# Patient Record
Sex: Female | Born: 1940 | Race: Black or African American | Hispanic: No | Smoking: Never smoker
Health system: Southern US, Community
[De-identification: ages and names within clinical notes are randomized; demographics above are authoritative.]

## PROBLEM LIST (undated history)

## (undated) DIAGNOSIS — N189 Chronic kidney disease, unspecified: Secondary | ICD-10-CM

## (undated) DIAGNOSIS — M2550 Pain in unspecified joint: Secondary | ICD-10-CM

## (undated) DIAGNOSIS — N201 Calculus of ureter: Secondary | ICD-10-CM

## (undated) DIAGNOSIS — Z87442 Personal history of urinary calculi: Secondary | ICD-10-CM

## (undated) DIAGNOSIS — R3915 Urgency of urination: Secondary | ICD-10-CM

## (undated) DIAGNOSIS — G4730 Sleep apnea, unspecified: Secondary | ICD-10-CM

## (undated) DIAGNOSIS — H9191 Unspecified hearing loss, right ear: Secondary | ICD-10-CM

## (undated) DIAGNOSIS — H905 Unspecified sensorineural hearing loss: Secondary | ICD-10-CM

## (undated) DIAGNOSIS — M1990 Unspecified osteoarthritis, unspecified site: Secondary | ICD-10-CM

## (undated) DIAGNOSIS — E785 Hyperlipidemia, unspecified: Secondary | ICD-10-CM

## (undated) DIAGNOSIS — K219 Gastro-esophageal reflux disease without esophagitis: Secondary | ICD-10-CM

## (undated) HISTORY — PX: INNER EAR SURGERY: SHX679

## (undated) HISTORY — PX: OTHER SURGICAL HISTORY: SHX169

## (undated) HISTORY — PX: CATARACT EXTRACTION, BILATERAL: SHX1313

## (undated) HISTORY — PX: DILATION AND CURETTAGE OF UTERUS: SHX78

## (undated) HISTORY — PX: OVARIAN CYST SURGERY: SHX726

---

## 1999-01-01 ENCOUNTER — Ambulatory Visit (HOSPITAL_COMMUNITY): Admission: RE | Admit: 1999-01-01 | Discharge: 1999-01-01 | Payer: Self-pay | Admitting: Family Medicine

## 1999-01-01 ENCOUNTER — Encounter: Payer: Self-pay | Admitting: Family Medicine

## 1999-06-30 ENCOUNTER — Ambulatory Visit (HOSPITAL_COMMUNITY): Admission: RE | Admit: 1999-06-30 | Discharge: 1999-06-30 | Payer: Self-pay | Admitting: Gastroenterology

## 1999-06-30 ENCOUNTER — Encounter: Payer: Self-pay | Admitting: Gastroenterology

## 2000-08-25 ENCOUNTER — Ambulatory Visit (HOSPITAL_COMMUNITY): Admission: RE | Admit: 2000-08-25 | Discharge: 2000-08-25 | Payer: Self-pay | Admitting: Family Medicine

## 2000-12-30 ENCOUNTER — Encounter: Payer: Self-pay | Admitting: Gastroenterology

## 2000-12-30 ENCOUNTER — Ambulatory Visit (HOSPITAL_COMMUNITY): Admission: RE | Admit: 2000-12-30 | Discharge: 2000-12-30 | Payer: Self-pay | Admitting: Gastroenterology

## 2001-05-10 ENCOUNTER — Encounter: Payer: Self-pay | Admitting: Family Medicine

## 2001-05-10 ENCOUNTER — Ambulatory Visit (HOSPITAL_COMMUNITY): Admission: RE | Admit: 2001-05-10 | Discharge: 2001-05-10 | Payer: Self-pay | Admitting: Family Medicine

## 2001-09-23 ENCOUNTER — Ambulatory Visit (HOSPITAL_BASED_OUTPATIENT_CLINIC_OR_DEPARTMENT_OTHER): Admission: RE | Admit: 2001-09-23 | Discharge: 2001-09-23 | Payer: Self-pay | Admitting: Family Medicine

## 2002-01-17 ENCOUNTER — Ambulatory Visit (HOSPITAL_BASED_OUTPATIENT_CLINIC_OR_DEPARTMENT_OTHER): Admission: RE | Admit: 2002-01-17 | Discharge: 2002-01-17 | Payer: Self-pay | Admitting: Family Medicine

## 2003-08-17 ENCOUNTER — Other Ambulatory Visit: Admission: RE | Admit: 2003-08-17 | Discharge: 2003-08-17 | Payer: Self-pay | Admitting: Family Medicine

## 2003-10-22 ENCOUNTER — Ambulatory Visit (HOSPITAL_COMMUNITY): Admission: RE | Admit: 2003-10-22 | Discharge: 2003-10-22 | Payer: Self-pay | Admitting: Family Medicine

## 2003-12-06 HISTORY — PX: COLONOSCOPY: SHX174

## 2003-12-06 HISTORY — PX: UPPER GASTROINTESTINAL ENDOSCOPY: SHX188

## 2004-01-04 ENCOUNTER — Ambulatory Visit (HOSPITAL_COMMUNITY): Admission: RE | Admit: 2004-01-04 | Discharge: 2004-01-04 | Payer: Self-pay | Admitting: Gastroenterology

## 2004-10-02 ENCOUNTER — Ambulatory Visit (HOSPITAL_COMMUNITY): Admission: RE | Admit: 2004-10-02 | Discharge: 2004-10-02 | Payer: Self-pay | Admitting: Gastroenterology

## 2004-10-06 ENCOUNTER — Encounter: Admission: RE | Admit: 2004-10-06 | Discharge: 2004-10-06 | Payer: Self-pay | Admitting: Gastroenterology

## 2009-01-16 ENCOUNTER — Encounter: Admission: RE | Admit: 2009-01-16 | Discharge: 2009-01-16 | Payer: Self-pay | Admitting: Family Medicine

## 2010-04-27 ENCOUNTER — Encounter: Payer: Self-pay | Admitting: Gastroenterology

## 2010-08-22 NOTE — Op Note (Signed)
NAME:  Penny Hernandez, Penny Hernandez              ACCOUNT NO.:  [id number]   MEDICAL RECORD NO.:  [id number]          PATIENT TYPE:  AMB   LOCATION:  ENDO                         FACILITY:  [facility]   PHYSICIAN:  James L. Malon Kindle., M.D.DATE OF BIRTH:  Jul 17, 1940   DATE OF PROCEDURE:  01/04/2004  DATE OF DISCHARGE:                                 OPERATIVE REPORT   PROCEDURE:  Colonoscopy.   MEDICATIONS:  The patient received a total of fentanyl 125 mcg, Versed 12 mg  for both procedures.   INDICATIONS:  Rectal bleeding in a woman with a strong family history of  colon cancer. Father has had colon cancer.   DESCRIPTION OF PROCEDURE:  Procedure had been explained to the patient and  consent obtained. With the patient in left lateral decubitus position,  Olympus scope was inserted and advanced under direct visualization. The  pediatric adjustable scope was used. Prep was excellent. Using abdominal  pressure and position changes, we were able to reach the cecum. The  ileocecal valve and appendiceal orifice were seen. The scope was withdrawn  to the cecum. Ascending colon, transverse colon, descending  and sigmoid  colon were seen well. There were no polyps seen and no diverticulosis. Scope  was withdrawn into the rectum. There were internal hemorrhoids seen in the  rectum. The scope was withdrawn, and the patient tolerated the procedure  well.   ASSESSMENT:  1.  Rectal bleeding, probably secondary to internal hemorrhoids, 578.1.  2.  Strong family history of colon cancer, V16.0.   PLAN:  Will give hemorrhoid instruction sheet and recommend repeating  colonoscopy in 5 years.      JLE/MEDQ  D:  01/04/2004  T:  01/04/2004  Job:  [id number]   cc:   Melida Quitter, M.D.  [address]  [place]  [place] [address]  Fax: [phone]

## 2010-08-22 NOTE — Op Note (Signed)
NAME:  Penny Hernandez, Penny Hernandez              ACCOUNT NO.:  [id number]   MEDICAL RECORD NO.:  [id number]          PATIENT TYPE:  AMB   LOCATION:  ENDO                         FACILITY:  [facility]   PHYSICIAN:  James L. Malon Kindle., M.D.DATE OF BIRTH:  1940/10/03   DATE OF PROCEDURE:  10/24/2004  DATE OF DISCHARGE:  10/02/2004                                 OPERATIVE REPORT   PROCEDURE:  Esophageal manometry.   INDICATIONS FOR PROCEDURE:  Chest pain possibly due to gastroesophageal  reflux or some sort of esophageal abnormality.  This is done to try to  identify these issues.   DESCRIPTION OF PROCEDURE:  The procedure was performed in the [facility]  Manometry Lab according to the usual protocol.  Results are as follows:  1.  Upper esophageal sphincter not shown on the tracings presented to me for      interpretation.  The computer analysis showed 86% relaxation.  2.  Esophageal body in the mid esophagus, the mean pressure was 78 mm, an      average of 11 swallows with a mean duration of 4.3 seconds.  Peristalsis      was normal and all swallows presented for evaluation.  3.  Lower esophageal sphincter mean pressure 19, relaxed with swallowing.   IMPRESSION:  Normal manometry with no gross abnormalities.   PLAN:  Proceed with a 24-hour pH study.       JLE/MEDQ  D:  10/24/2004  T:  10/24/2004  Job:  [id number]   cc:   Holley Bouche, M.D.  [address]  [place], [place] [address]  Fax: [phone]

## 2010-08-22 NOTE — Op Note (Signed)
NAME:  Penny Hernandez, Penny Hernandez              ACCOUNT NO.:  [id number]   MEDICAL RECORD NO.:  [id number]          PATIENT TYPE:  AMB   LOCATION:  ENDO                         FACILITY:  [facility]   PHYSICIAN:  James L. Malon Kindle., M.D.DATE OF BIRTH:  [birth year]   DATE OF PROCEDURE:  10/02/2004  DATE OF DISCHARGE:  10/02/2004                                 OPERATIVE REPORT   PROCEDURE:  A 24-hour pH study.   INDICATIONS FOR PROCEDURE:  Patient has had vague chest pain with  questionable esophageal reflux.  This study is done to evaluate for the  presence or absence of reflux.  Manometry done prior to this was normal.   RESULTS:  In the distal channel:  1.  Percent time pH less than 4, 1.1% with normal being less than 6.3% in      the upright position, in the recumbent position 0%.  Total time for pH      less than 4 was 11 minutes over 24 hours.  2.  Reflux episode 17, episodes greater than five minutes 0. Longest episode      2.8 minutes.  3.  Distal channel composite score (DeMeester score) 3.2 with normal being      less than 22.  4.  Symptom analysis:  The only significant symptom the patient had during      the study was vomiting which showed a poor correlation with reflux.  She      did have chest pain associated with vomiting, however.   IMPRESSION:  Study does not show significant reflux in a 24-period was  normal.  However, she does have vomiting which may be the source of her  symptoms.       JLE/MEDQ  D:  10/24/2004  T:  10/24/2004  Job:  [id number]   cc:   Holley Bouche, M.D.  [address]  [place], [place] [address]  Fax: [phone]

## 2010-08-22 NOTE — Op Note (Signed)
NAME:  Penny Hernandez, Penny Hernandez              ACCOUNT NO.:  [id number]   MEDICAL RECORD NO.:  [id number]          PATIENT TYPE:  AMB   LOCATION:  ENDO                         [facility]ACILITY:  [facility]   PHYSICIAN:  James L. Malon Kindle., M.D.DATE OF BIRTH:  11-06-1940   DATE OF PROCEDURE:  01/04/2004  DATE OF DISCHARGE:                                 OPERATIVE REPORT   PROCEDURE:  Esophagogastroduodenoscopy.   MEDICATIONS:  Lidocaine spray, fentanyl 62.5 mcg, Versed 6 mg IV.   INDICATIONS:  Chronic esophageal reflux.   DESCRIPTION OF PROCEDURE:  The procedure had been explained to the patient  and consent obtained. In the left lateral decubitus position, the Olympus  scope was inserted and advanced. The stomach was entered. The pylorus  identified and passed. The duodenum included the bulb and second portion  were seen _____________ unremarkable. The scope was withdrawn back into the  stomach. Pyloric channel was normal. The antrum and body of the stomach were  normal. There were no ulcerations or inflammation. Fundus and cardia were  seen in the retroflexed view and were normal. There was a hiatal hernia with  a widely patent GE junction. The distal and proximal esophagus were  endoscopically normal. The scope was withdrawn. The patient tolerated the  procedure well.   ASSESSMENT:  Widely patent gastroesophageal junction with no signs of  Barrett's esophagus, consistent with esophageal reflux disease, 530.81.  Continue Nexium. Will give her antireflux instructions and proceed with  colonoscopy at this time as planned.      JLE/MEDQ  D:  01/04/2004  T:  01/04/2004  Job:  [id number]   cc:   Melida Quitter, M.D.  [address]  [place]  [place] [address]  Fax: [phone]

## 2011-02-03 ENCOUNTER — Other Ambulatory Visit: Payer: Self-pay | Admitting: Family Medicine

## 2011-02-05 ENCOUNTER — Ambulatory Visit
Admission: RE | Admit: 2011-02-05 | Discharge: 2011-02-05 | Disposition: A | Discharge status: Home / Self Care | Payer: Medicare Other | Source: Ambulatory Visit | Attending: Family Medicine | Admitting: Family Medicine

## 2011-02-05 DIAGNOSIS — N189 Chronic kidney disease, unspecified: Secondary | ICD-10-CM

## 2011-02-05 HISTORY — DX: Chronic kidney disease, unspecified: N18.9

## 2011-02-05 MED ORDER — IOHEXOL 300 MG/ML  SOLN
125.0000 mL | Freq: Once | INTRAMUSCULAR | Status: AC | PRN
  Administered 2011-02-05: 125 mL via INTRAVENOUS

## 2011-02-16 ENCOUNTER — Other Ambulatory Visit: Payer: Self-pay | Admitting: Urology

## 2011-03-04 ENCOUNTER — Encounter (HOSPITAL_BASED_OUTPATIENT_CLINIC_OR_DEPARTMENT_OTHER): Payer: Self-pay | Admitting: *Deleted

## 2011-03-05 ENCOUNTER — Encounter (HOSPITAL_BASED_OUTPATIENT_CLINIC_OR_DEPARTMENT_OTHER): Payer: Self-pay | Admitting: *Deleted

## 2011-03-05 NOTE — H&P (Signed)
Penny Hernandez is a 70 year old female who presents for lithotripsy of a left ureteral stone. She had reported to him developing left lower quadrant pain which radiated into the left flank region and was associated with dark colored urine. She reported the pain was severe. She underwent a CT scan on 02/05/11 which revealed left hydronephrosis do to a 5 mm mid ureteral stone. It had measured Hounsfield units of approximately 650. There was also a 2 mm left renal stone that was nonobstructing. Since that time she has taken pain medication on a couple of occasions but has not had any further severe pain. She did pass a stone many years ago. She denies any fever, chills, nausea or vomiting but has noted a slight decrease in her appetite. The pain that she has had is not modified by positional change.   Past Medical History Problems  1. History of  Asthma 493.90 2. History of  Esophageal Reflux 530.81 3. History of  Hearing Loss 389.9 4. History of  Hypercholesterolemia 272.0 5. History of  Osteoarthritis V13.4 6. History of  Sleep Apnea 780.57 7. History of  Type 2 Diabetes Mellitus 250.00  Surgical History Problems  1. History of  Anterior Vesicourethropexy 2. History of  Cataract Surgery Bilateral 3. History of  Ear Surgery 4. History of  Hysteroscopy Of Uterus 5. History of  Oophorectomy V45.77  Current Meds 1. Advair Diskus 250-50 MCG/DOSE Inhalation Aerosol Powder Breath Activated; Therapy:  (Recorded:06Nov2012) to 2. CPAP; Therapy: (Recorded:06Nov2012) to 3. Fluticasone Propionate SUSP; Therapy: (Recorded:06Nov2012) to 4. Lotrisone 1-0.05 % External Cream; Therapy: (Recorded:06Nov2012) to 5. MetFORMIN HCl 500 MG Oral Tablet; Therapy: (Recorded:06Nov2012) to 6. Metoclopramide HCl 10 MG Oral Tablet; Therapy: (Recorded:06Nov2012) to 7. NexIUM 40 MG Oral Capsule Delayed Release; Therapy: (Recorded:06Nov2012) to 8. Pravastatin Sodium 40 MG Oral Tablet; Therapy: (Recorded:06Nov2012) to 9.  ProAir HFA 108 (90 Base) MCG/ACT Inhalation Aerosol Solution; Therapy:  (Recorded:06Nov2012) to 10. Sulindac 200 MG Oral Tablet; Therapy: (Recorded:06Nov2012) to 11. Triamcinolone Acetonide 0.1 % External Cream; Therapy: (Recorded:06Nov2012) to 12. Vitamin D 2000 UNIT Oral Tablet; Therapy: (Recorded:06Nov2012) to  Allergies Medication  1. No Known Drug Allergies  Family History Problems  1. Paternal history of  Colon Cancer V16.0 2. Family history of  Death In The Family Father 3. Family history of  Death In The Family Mother 4. Family history of  Family Health Status Children ___ Living Daughters 5. Family history of  Family Health Status Children ___ Living Sons 6. 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  Genitourinary: urinary urgency, nocturia, incontinence and urinary stream starts and stops.  Gastrointestinal: nausea, abdominal pain, heartburn and diarrhea.  Constitutional: night sweats and recent weight loss.  Integumentary: skin rash/lesion and pruritus.  ENT: sinus problems.    Vitals Vital Signs [Data Includes: Last 1 Day]   BMI Calculated: 37.61 BSA Calculated: 2.14 Height: 5 ft 6 in Weight: 234 lb  Blood Pressure: 141 / 86 Temperature: 97.6 F Heart Rate: 94  Physical Exam Constitutional: Well nourished and well developed . No acute distress.  ENT:. The  ears and nose are normal in appearance.  Neck: The appearance of the neck is normal and no neck mass is present.  Pulmonary: No  respiratory distress and normal respiratory rhythm and effort.  Cardiovascular: Heart rate and rhythm are normal . No peripheral edema.  Abdomen: The abdomen is mildly obese. The abdomen is soft and nontender. No masses are palpated. No CVA tenderness. No hernias are palpable. No hepatosplenomegaly noted.  Lymphatics: The femoral and inguinal nodes are not enlarged or tender.  Skin: Normal skin turgor, no visible rash and no visible skin lesions.  Neuro/Psych:. Mood and affect are appropriate.    Results/Data Urine [Data Includes: Last 1 Day]  06Nov2012  COLOR: YELLOW  Reference Range YELLOW APPEARANCE: CLEAR  Reference Range CLEAR SPECIFIC GRAVITY: 1.020  Reference Range 1.005-1.030 pH: 6.5  Reference Range 5.0-8.0 GLUCOSE: NEG mg/dL Reference Range NEG BILIRUBIN: NEG  Reference Range NEG KETONE: NEG mg/dL Reference Range NEG BLOOD: TRACE  Abnormal Reference Range NEG PROTEIN: TRACE mg/dL Reference Range NEG UROBILINOGEN: 0.2 mg/dL Reference Range 9.5-6.2 NITRITE: NEG  Reference Range NEG LEUKOCYTE ESTERASE: NEG  Reference Range NEG SQUAMOUS EPITHELIAL/HPF: FEW  Reference Range RARE WBC: 0-3 WBC/hpf Reference Range <4 RBC: 0-3 RBC/hpf Reference Range <4 BACTERIA: FEW  Abnormal Reference Range RARE CRYSTALS: NONE SEEN  Reference Range NEG CASTS: NONE SEEN  Reference Range NEG Other: FEW YEAST   Old records or history reviewed: Notes from Dr. Johnathan Hausen office as above.  The following images/tracing/specimen were independently visualized:  CT scan as above.  The following radiology reports were reviewed:Marland Kitchen    Assessment Assessed  1. Mid Ureteral Stone On The Left 592.1 2. Nephrolithiasis Of The Left Kidney 592.0 3. Hydronephrosis On The Left 591   I obtained a KUB today and noted that her stone lays over the iliac bone. It does not appear to a progressed. She does have hydronephrosis and has been having some decreased appetite so we discussed the treatment options. Medical  expulsive therapy could be undertaken although she does have some hydronephrosis and I think it would be best, since she remains symptomatic, to proceed with surgical intervention. Its location would preclude the use of lithotripsy so I went over ureteroscopy as the best option for her. We discussed the procedure in detail including its risks and complications as well as the probability of success and the need for a stent after the procedure. She understands and has elected to proceed.   Plan Health Maintenance (V70.0)  1. UA With REFLEX  Done: 06Nov2012 11:05AM Mid Ureteral Stone On The Left (592.1), Hydronephrosis On The Left (591)  2. Follow-up Schedule Surgery Office  Follow-up  Requested for: 06Nov2012 Mid Ureteral Stone On The Left (592.1), Nephrolithiasis Of The Left Kidney (592.0)  3. KUB  Requested for: 06Nov2012   She will be scheduled for an outpatient left ureteroscopy and laser lithotripsy of her left distal ureteral stone.

## 2011-03-05 NOTE — Progress Notes (Signed)
NPO AFTER MN. PT ARRIVES AT [phone]. NEEDS ISTAT AND EKG. MAY TAKE HYDROCODONE IF NEEDED W/ SIP OF WATER.

## 2011-03-06 ENCOUNTER — Ambulatory Visit (HOSPITAL_BASED_OUTPATIENT_CLINIC_OR_DEPARTMENT_OTHER)
Admission: RE | Admit: 2011-03-06 | Discharge: 2011-03-06 | Disposition: A | Discharge status: Home / Self Care | Payer: Medicare Other | Source: Ambulatory Visit | Attending: Urology | Admitting: Urology

## 2011-03-06 ENCOUNTER — Encounter (HOSPITAL_BASED_OUTPATIENT_CLINIC_OR_DEPARTMENT_OTHER): Payer: Self-pay | Admitting: *Deleted

## 2011-03-06 ENCOUNTER — Ambulatory Visit (HOSPITAL_BASED_OUTPATIENT_CLINIC_OR_DEPARTMENT_OTHER): Payer: Medicare Other | Admitting: Anesthesiology

## 2011-03-06 ENCOUNTER — Encounter (HOSPITAL_BASED_OUTPATIENT_CLINIC_OR_DEPARTMENT_OTHER): Payer: Self-pay | Admitting: Anesthesiology

## 2011-03-06 ENCOUNTER — Encounter (HOSPITAL_BASED_OUTPATIENT_CLINIC_OR_DEPARTMENT_OTHER)
Admission: RE | Disposition: A | Discharge status: Home / Self Care | Payer: Self-pay | Source: Ambulatory Visit | Attending: Urology

## 2011-03-06 ENCOUNTER — Other Ambulatory Visit: Payer: Self-pay

## 2011-03-06 DIAGNOSIS — J45909 Unspecified asthma, uncomplicated: Secondary | ICD-10-CM | POA: Insufficient documentation

## 2011-03-06 DIAGNOSIS — E7800 Pure hypercholesterolemia, unspecified: Secondary | ICD-10-CM | POA: Insufficient documentation

## 2011-03-06 DIAGNOSIS — N201 Calculus of ureter: Secondary | ICD-10-CM | POA: Insufficient documentation

## 2011-03-06 DIAGNOSIS — Z79899 Other long term (current) drug therapy: Secondary | ICD-10-CM | POA: Insufficient documentation

## 2011-03-06 DIAGNOSIS — K219 Gastro-esophageal reflux disease without esophagitis: Secondary | ICD-10-CM | POA: Insufficient documentation

## 2011-03-06 DIAGNOSIS — E119 Type 2 diabetes mellitus without complications: Secondary | ICD-10-CM | POA: Insufficient documentation

## 2011-03-06 HISTORY — DX: Personal history of urinary calculi: Z87.442

## 2011-03-06 HISTORY — DX: Urgency of urination: R39.15

## 2011-03-06 HISTORY — DX: Sleep apnea, unspecified: G47.30

## 2011-03-06 HISTORY — DX: Unspecified hearing loss, right ear: H91.91

## 2011-03-06 HISTORY — DX: Unspecified sensorineural hearing loss: H90.5

## 2011-03-06 HISTORY — DX: Calculus of ureter: N20.1

## 2011-03-06 HISTORY — DX: Pain in unspecified joint: M25.50

## 2011-03-06 HISTORY — PX: URETEROSCOPY: SHX842

## 2011-03-06 HISTORY — DX: Gastro-esophageal reflux disease without esophagitis: K21.9

## 2011-03-06 HISTORY — PX: CYSTOSCOPY/RETROGRADE/URETEROSCOPY/STONE EXTRACTION WITH BASKET: SHX5317

## 2011-03-06 LAB — GLUCOSE, CAPILLARY: Glucose-Capillary: 133 mg/dL — ABNORMAL HIGH (ref 70–99)

## 2011-03-06 LAB — POCT I-STAT 4, (NA,K, GLUC, HGB,HCT)
Glucose, Bld: 128 mg/dL — ABNORMAL HIGH (ref 70–99)
HCT: 39 % (ref 36.0–46.0)
Hemoglobin: 13.3 g/dL (ref 12.0–15.0)
Potassium: 3.6 mEq/L (ref 3.5–5.1)
Sodium: 142 mEq/L (ref 135–145)

## 2011-03-06 SURGERY — URETEROSCOPY
Anesthesia: General | Site: Ureter | Laterality: Left | Wound class: Clean Contaminated

## 2011-03-06 MED ORDER — KETOROLAC TROMETHAMINE 30 MG/ML IJ SOLN: 15.0000 mg | Freq: Once | INTRAMUSCULAR | Status: DC | PRN

## 2011-03-06 MED ORDER — CIPROFLOXACIN IN D5W 200 MG/100ML IV SOLN
200.0000 mg | INTRAVENOUS | Status: AC
  Administered 2011-03-06: 200 mg via INTRAVENOUS

## 2011-03-06 MED ORDER — PHENAZOPYRIDINE HCL 200 MG PO TABS: 200.0000 mg | ORAL_TABLET | Freq: Three times a day (TID) | ORAL | Status: AC | PRN

## 2011-03-06 MED ORDER — FENTANYL CITRATE 0.05 MG/ML IJ SOLN: 25.0000 ug | INTRAMUSCULAR | Status: DC | PRN

## 2011-03-06 MED ORDER — PROPOFOL 10 MG/ML IV EMUL
INTRAVENOUS | Status: DC | PRN
  Administered 2011-03-06: 200 mg via INTRAVENOUS

## 2011-03-06 MED ORDER — LACTATED RINGERS IV SOLN
INTRAVENOUS | Status: DC
  Administered 2011-03-06: 10:00:00 via INTRAVENOUS

## 2011-03-06 MED ORDER — SODIUM CHLORIDE 0.9 % IR SOLN
Status: DC | PRN
  Administered 2011-03-06: 3000 mL

## 2011-03-06 MED ORDER — FENTANYL CITRATE 0.05 MG/ML IJ SOLN
INTRAMUSCULAR | Status: DC | PRN
  Administered 2011-03-06: 50 ug via INTRAVENOUS

## 2011-03-06 MED ORDER — MIDAZOLAM HCL 5 MG/5ML IJ SOLN
INTRAMUSCULAR | Status: DC | PRN
  Administered 2011-03-06: 1 mg via INTRAVENOUS

## 2011-03-06 MED ORDER — LIDOCAINE HCL (CARDIAC) 20 MG/ML IV SOLN
INTRAVENOUS | Status: DC | PRN
  Administered 2011-03-06: 60 mg via INTRAVENOUS

## 2011-03-06 MED ORDER — HYDROCODONE-ACETAMINOPHEN 10-300 MG PO TABS: 1.0000 | ORAL_TABLET | Freq: Four times a day (QID) | ORAL | Status: DC | PRN

## 2011-03-06 MED ORDER — IOHEXOL 350 MG/ML SOLN
INTRAVENOUS | Status: DC | PRN
  Administered 2011-03-06: 50 mL via INTRAVENOUS

## 2011-03-06 SURGICAL SUPPLY — 27 items
ADAPTER CATH URET PLST 4-6FR (CATHETERS) ×2 IMPLANT
BAG DRAIN URO-CYSTO SKYTR STRL (DRAIN) ×2 IMPLANT
BASKET LASER NITINOL 1.9FR (BASKET) IMPLANT
BASKET STNLS GEMINI 4WIRE 3FR (BASKET) IMPLANT
BASKET ZERO TIP NITINOL 2.4FR (BASKET) IMPLANT
BRUSH URET BIOPSY 3F (UROLOGICAL SUPPLIES) IMPLANT
CANISTER SUCT LVC 12 LTR MEDI- (MISCELLANEOUS) IMPLANT
CATH INTERMIT  6FR 70CM (CATHETERS) ×2 IMPLANT
CATH URET 5FR 28IN CONE TIP (BALLOONS)
CATH URET 5FR 70CM CONE TIP (BALLOONS) IMPLANT
CLOTH BEACON ORANGE TIMEOUT ST (SAFETY) ×2 IMPLANT
DRAPE CAMERA CLOSED 9X96 (DRAPES) ×2 IMPLANT
ELECT REM PT RETURN 9FT ADLT (ELECTROSURGICAL)
ELECTRODE REM PT RTRN 9FT ADLT (ELECTROSURGICAL) IMPLANT
GLOVE BIO SURGEON STRL SZ 6.5 (GLOVE) ×2 IMPLANT
GLOVE BIO SURGEON STRL SZ8 (GLOVE) ×2 IMPLANT
GLOVE ECLIPSE 6.0 STRL STRAW (GLOVE) ×2 IMPLANT
GOWN BRE IMP SLV AUR LG STRL (GOWN DISPOSABLE) ×2 IMPLANT
GOWN PREVENTION PLUS LG XLONG (DISPOSABLE) ×2 IMPLANT
GOWN STRL REIN XL XLG (GOWN DISPOSABLE) ×2 IMPLANT
GUIDEWIRE ANG ZIPWIRE 038X150 (WIRE) IMPLANT
GUIDEWIRE STR DUAL SENSOR (WIRE) ×2 IMPLANT
IV NS IRRIG 3000ML ARTHROMATIC (IV SOLUTION) ×4 IMPLANT
LASER FIBER DISP (UROLOGICAL SUPPLIES) IMPLANT
PACK CYSTOSCOPY (CUSTOM PROCEDURE TRAY) ×2 IMPLANT
SHEATH URET ACCESS 12FR/35CM (UROLOGICAL SUPPLIES) IMPLANT
SHEATH URET ACCESS 12FR/55CM (UROLOGICAL SUPPLIES) IMPLANT

## 2011-03-06 NOTE — Transfer of Care (Signed)
Immediate Anesthesia Transfer of Care Note  Patient: Penny Hernandez  Procedure(s) Performed:  URETEROSCOPY - C-arm, camera, Digital Ureteroscope, Holmium Laser needed; CYSTOSCOPY/RETROGRADE/URETEROSCOPY/STONE EXTRACTION WITH BASKET  Patient Location: PACU  Anesthesia Type: General  Level of Consciousness: awake, alert  and oriented  Airway & Oxygen Therapy: Patient Spontanous Breathing and Patient connected to face mask oxygen  Post-op Assessment: Report given to PACU RN and Post -op Vital signs reviewed and stable  Post vital signs: Reviewed and stable  Complications: No apparent anesthesia complications

## 2011-03-06 NOTE — Anesthesia Postprocedure Evaluation (Signed)
  Anesthesia Post-op Note  Patient: Penny Hernandez  Procedure(s) Performed:  URETEROSCOPY - C-arm, camera, Digital Ureteroscope, Holmium Laser needed; CYSTOSCOPY/RETROGRADE/URETEROSCOPY/STONE EXTRACTION WITH BASKET  Patient Location: PACU  Anesthesia Type: General  Level of Consciousness: awake and alert   Airway and Oxygen Therapy: Patient Spontanous Breathing  Post-op Pain: mild  Post-op Assessment: Post-op Vital signs reviewed, Patient's Cardiovascular Status Stable, Respiratory Function Stable, Patent Airway and No signs of Nausea or vomiting  Post-op Vital Signs: stable  Complications: No apparent anesthesia complications

## 2011-03-06 NOTE — Op Note (Signed)
PATIENT:  Penny Hernandez  PRE-OPERATIVE DIAGNOSIS:  Left ureteral stone  POST-OPERATIVE DIAGNOSIS:  Same  PROCEDURE:  Procedure(s): 1. Cystoscopy with left retrograde pyelogram including interpretation. 2. Left ureteroscopy with laser lithotripsy and stone extraction. 3. Left Polaris stent placement (5 [place], 24 cm, string attached)   SURGEON:  Surgeon: Garnett Farm  INDICATION: Mrs. Ramsaran is a 70 year old female who was seen recently for a left ureteral calculus that was noted at the time of initial CT scanning on 02/05/11. The stone was noted the 5 mm and located in the mid ureter with Hounsfield units of approximately 650. She was placed on medical expulsive therapy and followup revealed the stone had not progressed and due to the fact that she remains symptomatic we discussed the surgical options for treatment and she has elected to proceed with the above-noted procedure.  ANESTHESIA:  General  EBL:  Minimal  DRAINS: 5 French, 24 cm Polaris stent (with string)  LOCAL MEDICATIONS USED:  None  SPECIMEN:   Stone  DISPOSITION OF SPECIMEN:  Given the patient  Description of procedure:  Patient was taken to the major OR and placed on the table.  She was administered general anesthesia and then moved to the dorsal lithotomy position.  Her genitalia was sterilely prepped and draped. An official timeout was performed.  Initially the 22 French cystoscope with 12 lens was passed under direct vision down the urethra which was noted be entirely normal. The bladder was then entered and fully inspected. It was noted be free of any tumors stones or inflammatory lesions. Ureteral orifices were of normal configuration and position. A 6 French open-ended ureteral catheter was then passed through the cystoscope into the left ureteral orifice in order to perform a left retrograde pyelogram.  A left retrograde pyelogram was performed by injecting full-strength contrast up the left ureter under  direct fluoroscopic control. It revealed a filling defect in the distal left ureter consistent with the stone. The remainder of the ureter was noted to be normal as was the intrarenal collecting system. I then passed a 0.038 inch floppy-tipped guidewire through the open ended catheter and into the area of the renal pelvis and this was left in place. The inner portion of a ureteral access sheath was then passed over the guidewire to gently dilate the intramural ureter. I then proceeded with ureteroscopy.  A 6 French rigid ureteroscope was then passed under direct vision through  the urethra and into the left orifice and up the left ureter. The stone was identified and I felt it was too large to extract and therefore elected to proceed with laser lithotripsy. The 200  holmium laser fiber was used to fragment the stone. I then used the nitinol basket to extract all of the stone fragments and reinspection of the ureter ureteroscopically revealed no further stone fragments and no injury to the ureter. I therefore backloaded the cystoscope over the guidewire and passed the stent over the guidewire into the area of the renal pelvis. As the guidewire was removed good curl was noted in the renal pelvis. The bladder was drained and the cystoscope was then removed. The patient tolerated the procedure well no intraoperative complications.  PLAN OF CARE: Discharge to home after PACU  PATIENT DISPOSITION:  PACU - hemodynamically stable.

## 2011-03-06 NOTE — Anesthesia Preprocedure Evaluation (Addendum)
Anesthesia Evaluation  Patient identified by MRN, date of birth, ID band Patient awake    Reviewed: Allergy & Precautions, H&P , NPO status , Patient's Chart, lab work & pertinent test results  Airway Mallampati: II TM Distance: <3 FB Neck ROM: Full    Dental No notable dental hx.    Pulmonary neg pulmonary ROS, sleep apnea ,  clear to auscultation  Pulmonary exam normal       Cardiovascular neg cardio ROS Regular Normal    Neuro/Psych Negative Neurological ROS  Negative Psych ROS   GI/Hepatic negative GI ROS, Neg liver ROS, GERD-  ,  Endo/Other  Negative Endocrine ROSDiabetes mellitus-Morbid obesity  Renal/GU negative Renal ROS  Genitourinary negative   Musculoskeletal negative musculoskeletal ROS (+)   Abdominal   Peds negative pediatric ROS (+)  Hematology negative hematology ROS (+)   Anesthesia Other Findings   Reproductive/Obstetrics negative OB ROS                          Anesthesia Physical Anesthesia Plan  ASA: III  Anesthesia Plan: General   Post-op Pain Management:    Induction: Intravenous  Airway Management Planned: LMA  Additional Equipment:   Intra-op Plan:   Post-operative Plan:   Informed Consent: I have reviewed the patients History and Physical, chart, labs and discussed the procedure including the risks, benefits and alternatives for the proposed anesthesia with the patient or authorized representative who has indicated his/her understanding and acceptance.   Dental advisory given  Plan Discussed with: CRNA  Anesthesia Plan Comments:         Anesthesia Quick Evaluation

## 2011-03-06 NOTE — Anesthesia Procedure Notes (Signed)
Procedure Name: LMA Insertion Date/Time: 03/06/2011 10:27 AM Performed by: Huel Coventry Pre-anesthesia Checklist: Patient identified, Emergency Drugs available, Suction available and Patient being monitored Patient Re-evaluated:Patient Re-evaluated prior to inductionOxygen Delivery Method: Circle System Utilized Preoxygenation: Pre-oxygenation with 100% oxygen Intubation Type: IV induction Ventilation: Mask ventilation without difficulty LMA: LMA inserted LMA Size: 4.0 Number of attempts: 1 Airway Equipment and Method: bite block Placement Confirmation: positive ETCO2 Tube secured with: Tape Dental Injury: Teeth and Oropharynx as per pre-operative assessment

## 2011-04-22 ENCOUNTER — Other Ambulatory Visit: Payer: Self-pay | Admitting: Obstetrics and Gynecology

## 2011-05-06 ENCOUNTER — Encounter (HOSPITAL_COMMUNITY): Payer: Self-pay | Admitting: Pharmacist

## 2011-05-13 ENCOUNTER — Other Ambulatory Visit: Payer: Self-pay | Admitting: Obstetrics and Gynecology

## 2011-05-13 ENCOUNTER — Encounter (HOSPITAL_COMMUNITY): Payer: Self-pay

## 2011-05-13 ENCOUNTER — Encounter (HOSPITAL_COMMUNITY)
Admission: RE | Admit: 2011-05-13 | Discharge: 2011-05-13 | Disposition: A | Discharge status: Home / Self Care | Payer: Medicare Other | Source: Ambulatory Visit | Attending: Obstetrics and Gynecology | Admitting: Obstetrics and Gynecology

## 2011-05-13 DIAGNOSIS — Z90710 Acquired absence of both cervix and uterus: Secondary | ICD-10-CM | POA: Diagnosis not present

## 2011-05-13 DIAGNOSIS — N9489 Other specified conditions associated with female genital organs and menstrual cycle: Secondary | ICD-10-CM | POA: Diagnosis not present

## 2011-05-13 DIAGNOSIS — N838 Other noninflammatory disorders of ovary, fallopian tube and broad ligament: Secondary | ICD-10-CM

## 2011-05-13 HISTORY — DX: Unspecified osteoarthritis, unspecified site: M19.90

## 2011-05-13 HISTORY — DX: Chronic kidney disease, unspecified: N18.9

## 2011-05-13 HISTORY — DX: Hyperlipidemia, unspecified: E78.5

## 2011-05-13 LAB — COMPREHENSIVE METABOLIC PANEL
Albumin: 4 g/dL (ref 3.5–5.2)
Alkaline Phosphatase: 106 U/L (ref 39–117)
BUN: 16 mg/dL (ref 6–23)
Chloride: 105 mEq/L (ref 96–112)
Glucose, Bld: 108 mg/dL — ABNORMAL HIGH (ref 70–99)
Potassium: 4 mEq/L (ref 3.5–5.1)
Total Bilirubin: 0.4 mg/dL (ref 0.3–1.2)

## 2011-05-13 LAB — CBC
HCT: 41.3 % (ref 36.0–46.0)
Hemoglobin: 13.3 g/dL (ref 12.0–15.0)
RBC: 4.46 MIL/uL (ref 3.87–5.11)
RDW: 12.3 % (ref 11.5–15.5)
WBC: 4.5 10*3/uL (ref 4.0–10.5)

## 2011-05-13 NOTE — Patient Instructions (Addendum)
   Your procedure is scheduled on:  Wednesday, Feb 13th Enter through the [employer] [facility] at: 930am Pick up the phone at the desk and dial [phone] and inform us of your arrival.  Please call this number if you have any problems the morning of surgery: [phone]  Remember: Do not eat food after midnight: Tuesday Do not drink clear liquids after: Tuesday Take these medicines the morning of surgery with a SIP OF WATER: Pravastatin  Do not wear jewelry, make-up, or FINGER nail polish Do not wear lotions, powders, perfumes or deodorant. Do not shave 48 hours prior to surgery. Do not bring valuables to the hospital.   Patients discharged on the day of surgery will not be allowed to drive home.  Home with Daughter Scherrie November  cell [phone] or Daughter Richrd Humbles  cell [phone].   Remember to use your hibiclens as instructed.Please shower with 1/2 bottle the evening before your surgery and the other 1/2 bottle the morning of surgery.

## 2011-05-14 ENCOUNTER — Ambulatory Visit (HOSPITAL_COMMUNITY)
Admission: RE | Admit: 2011-05-14 | Discharge: 2011-05-14 | Disposition: A | Discharge status: Home / Self Care | Payer: Medicare Other | Source: Ambulatory Visit | Attending: Obstetrics and Gynecology | Admitting: Obstetrics and Gynecology

## 2011-05-14 DIAGNOSIS — N838 Other noninflammatory disorders of ovary, fallopian tube and broad ligament: Secondary | ICD-10-CM

## 2011-05-14 DIAGNOSIS — N9489 Other specified conditions associated with female genital organs and menstrual cycle: Secondary | ICD-10-CM | POA: Diagnosis not present

## 2011-05-14 DIAGNOSIS — Z90710 Acquired absence of both cervix and uterus: Secondary | ICD-10-CM | POA: Insufficient documentation

## 2011-05-15 NOTE — H&P (Signed)
Penny Hernandez, DODDRIDGE NO.:  [id number]  MEDICAL RECORD NO.:  [id number]  LOCATION:                                 FACILITY:  PHYSICIAN:  [name], M.D.DATE OF BIRTH:  02/21/1941  DATE OF ADMISSION:  05/20/2011 DATE OF DISCHARGE:                             HISTORY & PHYSICAL   Date for surgery is May 20, 2011.  HISTORY OF PRESENT ILLNESS:  [name] is a 71 year old female, para 3- 0-0-3, who presents for a laparoscopic oophorectomy.  The patient has been followed at the [facility] and Gynecology Division of [facility].  The patient complains of fatigue and weight loss.  An ultrasound and CT scan were performed, which show an enlarged right ovary that was atypical for postmenopausal female.  Her CA-125 was within normal limits.  The patient is status post hysterectomy.  DRUG ALLERGIES:  No known drug allergies.  OBSTETRICAL HISTORY:  The patient has had 3 term vaginal deliveries.  PAST MEDICAL HISTORY:  The patient has a history of kidney stones.  She has had a hysterectomy as mentioned above.  She has diabetes.  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  She has had one of her ovaries removed.  She has increased cholesterol and gastroesophageal reflux disease.  CURRENT MEDICATIONS:  Nexium, metformin, pravastatin, and vitamin D.  SOCIAL HISTORY:  The patient denies cigarette use, alcohol use, and recreational drug use.  REVIEW OF SYSTEMS:  The patient has trouble hearing, particularly in her left ear.  See history of present illness.  FAMILY HISTORY:  The patient has a family history of colon cancer, asthma, emphysema, and rheumatic fever.  PHYSICAL EXAMINATION:  VITAL SIGNS:  Height is 5 feet 7 inches, weight is 233 pounds. HEENT:  Within normal limits. CHEST:  Clear. HEART:  Regular rate and rhythm. BREASTS:  Without masses. ABDOMEN:  Nontender, there are no fluid waves present. EXTREMITIES:   Grossly normal. NEUROLOGIC:  Grossly normal. PELVIC:  External genitalia is normal.  Vagina is normal except for relaxation.  Cervix is absent.  Uterus is absent.  Adnexa, no masses are appreciated and rectovaginal exam confirms.  ASSESSMENT: 1. Enlarged right ovary. 2. Fatigue. 3. Weight loss of uncertain etiology. 4. Diabetes. 5. Hypercholesterolemia. 6. Kidney stones. 7. Gastroesophageal reflux disease. 8. Obesity.  PLAN:  The patient will undergo a diagnostic laparoscopy and possibly a right laparoscopic oophorectomy.  She understands the indications for her surgical procedure, and she accepts the risks of, but not limited to, anesthetic complications, bleeding, infections, and possible damage to the surrounding organs.     [name], M.D.  05/18/2011 Surgery canceled. Repeat ultrasound showed no evidence of an enlarged ovary or pelvic mass.  AVS     AVS/MEDQ  D:  05/14/2011  T:  05/15/2011  Job:  [id number]

## 2011-05-18 DIAGNOSIS — R1900 Intra-abdominal and pelvic swelling, mass and lump, unspecified site: Secondary | ICD-10-CM | POA: Diagnosis not present

## 2011-05-20 ENCOUNTER — Ambulatory Visit (HOSPITAL_COMMUNITY)
Admission: RE | Admit: 2011-05-20 | Payer: Medicare Other | Source: Ambulatory Visit | Admitting: Obstetrics and Gynecology

## 2011-05-20 ENCOUNTER — Encounter (HOSPITAL_COMMUNITY): Admission: RE | Payer: Self-pay | Source: Ambulatory Visit

## 2011-05-20 SURGERY — LAPAROSCOPY OPERATIVE
Anesthesia: General | Laterality: Right

## 2011-05-28 DIAGNOSIS — R634 Abnormal weight loss: Secondary | ICD-10-CM | POA: Diagnosis not present

## 2011-05-29 DIAGNOSIS — M79609 Pain in unspecified limb: Secondary | ICD-10-CM | POA: Diagnosis not present

## 2011-05-29 DIAGNOSIS — B351 Tinea unguium: Secondary | ICD-10-CM | POA: Diagnosis not present

## 2011-06-03 ENCOUNTER — Encounter: Payer: Federal, State, Local not specified - PPO | Admitting: Obstetrics and Gynecology

## 2011-09-02 DIAGNOSIS — M79609 Pain in unspecified limb: Secondary | ICD-10-CM | POA: Diagnosis not present

## 2011-09-02 DIAGNOSIS — B351 Tinea unguium: Secondary | ICD-10-CM | POA: Diagnosis not present

## 2011-10-14 DIAGNOSIS — H26499 Other secondary cataract, unspecified eye: Secondary | ICD-10-CM | POA: Diagnosis not present

## 2011-10-19 ENCOUNTER — Ambulatory Visit (INDEPENDENT_AMBULATORY_CARE_PROVIDER_SITE_OTHER): Payer: Medicare Other | Admitting: Obstetrics and Gynecology

## 2011-10-19 ENCOUNTER — Encounter: Payer: Self-pay | Admitting: Obstetrics and Gynecology

## 2011-10-19 ENCOUNTER — Other Ambulatory Visit (INDEPENDENT_AMBULATORY_CARE_PROVIDER_SITE_OTHER): Payer: Medicare Other

## 2011-10-19 VITALS — BP 130/64 | Temp 98.6°F | Resp 16 | Ht 66.5 in | Wt 239.0 lb

## 2011-10-19 DIAGNOSIS — N7013 Chronic salpingitis and oophoritis: Secondary | ICD-10-CM | POA: Diagnosis not present

## 2011-10-19 DIAGNOSIS — N839 Noninflammatory disorder of ovary, fallopian tube and broad ligament, unspecified: Secondary | ICD-10-CM

## 2011-10-19 DIAGNOSIS — K921 Melena: Secondary | ICD-10-CM | POA: Diagnosis not present

## 2011-10-19 DIAGNOSIS — N7011 Chronic salpingitis: Secondary | ICD-10-CM | POA: Insufficient documentation

## 2011-10-19 DIAGNOSIS — N949 Unspecified condition associated with female genital organs and menstrual cycle: Secondary | ICD-10-CM | POA: Diagnosis not present

## 2011-10-19 DIAGNOSIS — IMO0002 Reserved for concepts with insufficient information to code with codable children: Secondary | ICD-10-CM

## 2011-10-19 DIAGNOSIS — Z9079 Acquired absence of other genital organ(s): Secondary | ICD-10-CM

## 2011-10-19 DIAGNOSIS — N838 Other noninflammatory disorders of ovary, fallopian tube and broad ligament: Secondary | ICD-10-CM

## 2011-10-19 DIAGNOSIS — R102 Pelvic and perineal pain: Secondary | ICD-10-CM

## 2011-10-19 DIAGNOSIS — E119 Type 2 diabetes mellitus without complications: Secondary | ICD-10-CM

## 2011-10-19 DIAGNOSIS — Z719 Counseling, unspecified: Secondary | ICD-10-CM

## 2011-10-19 DIAGNOSIS — E1149 Type 2 diabetes mellitus with other diabetic neurological complication: Secondary | ICD-10-CM | POA: Insufficient documentation

## 2011-10-19 DIAGNOSIS — E669 Obesity, unspecified: Secondary | ICD-10-CM | POA: Insufficient documentation

## 2011-10-19 DIAGNOSIS — Z90710 Acquired absence of both cervix and uterus: Secondary | ICD-10-CM | POA: Insufficient documentation

## 2011-10-19 DIAGNOSIS — E7800 Pure hypercholesterolemia, unspecified: Secondary | ICD-10-CM | POA: Insufficient documentation

## 2011-10-19 DIAGNOSIS — N8111 Cystocele, midline: Secondary | ICD-10-CM

## 2011-10-19 LAB — POCT URINALYSIS DIPSTICK
Ketones, UA: NEGATIVE
Leukocytes, UA: NEGATIVE
pH, UA: 6

## 2011-10-19 NOTE — Progress Notes (Addendum)
Vag. Discharge:no Odor:no Fever:no Irreg.Periods:no Dyspareunia:no Dysuria:no Frequency:yes Urgency:yes Hematuria:no Kidney stones:yes per pt a few months ago pt had it surgically removed Constipation:no Diarrhea:no Rectal Bleeding: no Vomiting:no Nausea:yes "Sometimes" Pregnant:no Fibroids:no Endometriosis:no Hx of Ovarian Cyst:yes pt had Left Cyst removed.  Hx IUD:no Hx STD-PID:no Appendectomy:no Gall Bladder Dz:no  HISTORY OF PRESENT ILLNESS  [name] is a 71 y.o. year old female,No obstetric history on file., who presents for a problem visit. The patient has had a total of normal hysterectomy and left oophorectomy.  A CT scan in November 2012 showed a 5.7 x 4.5 x 3.7 cm asymmetric enlargement of the right ovary.  An ultrasound was performed that showed a 5.0 x 4.3 x 3.7 cm cystic mass in the right adnexa that seemed most consistent with a hydrosalpinx.  No right ovary was identified.  A CA 125 blood tests was normal. The patient has a history of kidney stones.  Subjective:  The patient complains of a vague lower bilateral abdominal pain.  She also complains of a right mid abdominal pain.  She has occasional constipation and she has had rectal bleeding.  Objective:  BP 130/64  Temp 98.6 F (37 C) (Oral)  Resp 16  Ht 5' 6.5" (1.689 m)  Wt 239 lb (108.41 kg)  BMI 38.00 kg/m2   General: alert and no distress  Abdomen: No masses, tenderness, or abnormal skin changes are noted.  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  External genitalia: normal general appearance Vaginal: pelvic relaxation with cystocele and rectocele Adnexa: no masses or tenderness appreciated  Ultrasound: The uterus and left ovary are surgically absent.  There is a cystic tubular structure measuring 3.9 x 3.6 cm in the right adnexa.  There is also a cystic structure at the left vaginal cuff measuring 3.7 x 3.4 cm.  No solid component is seen.  Urinalysis:  Negative.  Assessment:  Hydrosalpinx Pelvic pain Hematochezia Obesity Diabetes History of kidney stones  Plan:  The evaluation and management of a hydrosalpinx and pelvic adhesions were discussed.  Surgery was reviewed although I do not recommend at this time.  The patient will consider this option and call if she wants to proceed.  Repeat ultrasound in 4-6 months.  Refer patient to a gastroenterologist for evaluation of rectal bleeding.  Return to office in 6 week(s).  30 min. Office evaluation was greater than 50% being face-to-face.   Leonard Schwartz M.D.  10/19/2011 5:43 PM

## 2011-10-20 ENCOUNTER — Other Ambulatory Visit: Payer: Self-pay | Admitting: Obstetrics and Gynecology

## 2011-10-20 DIAGNOSIS — N838 Other noninflammatory disorders of ovary, fallopian tube and broad ligament: Secondary | ICD-10-CM

## 2011-11-18 ENCOUNTER — Other Ambulatory Visit: Payer: Self-pay | Admitting: Gastroenterology

## 2011-11-18 DIAGNOSIS — R1310 Dysphagia, unspecified: Secondary | ICD-10-CM

## 2011-11-19 DIAGNOSIS — N6049 Mammary duct ectasia of unspecified breast: Secondary | ICD-10-CM | POA: Diagnosis not present

## 2011-11-19 DIAGNOSIS — N6459 Other signs and symptoms in breast: Secondary | ICD-10-CM | POA: Diagnosis not present

## 2011-11-24 ENCOUNTER — Ambulatory Visit
Admission: RE | Admit: 2011-11-24 | Discharge: 2011-11-24 | Disposition: A | Discharge status: Home / Self Care | Payer: Medicare Other | Source: Ambulatory Visit | Attending: Gastroenterology | Admitting: Gastroenterology

## 2011-11-24 DIAGNOSIS — K219 Gastro-esophageal reflux disease without esophagitis: Secondary | ICD-10-CM | POA: Diagnosis not present

## 2011-11-24 DIAGNOSIS — K228 Other specified diseases of esophagus: Secondary | ICD-10-CM | POA: Diagnosis not present

## 2011-11-24 DIAGNOSIS — R1310 Dysphagia, unspecified: Secondary | ICD-10-CM | POA: Diagnosis not present

## 2011-12-01 ENCOUNTER — Encounter: Payer: Medicare Other | Admitting: Obstetrics and Gynecology

## 2011-12-10 DIAGNOSIS — E785 Hyperlipidemia, unspecified: Secondary | ICD-10-CM | POA: Diagnosis not present

## 2011-12-11 DIAGNOSIS — M79609 Pain in unspecified limb: Secondary | ICD-10-CM | POA: Diagnosis not present

## 2011-12-11 DIAGNOSIS — B351 Tinea unguium: Secondary | ICD-10-CM | POA: Diagnosis not present

## 2011-12-18 ENCOUNTER — Ambulatory Visit (INDEPENDENT_AMBULATORY_CARE_PROVIDER_SITE_OTHER): Payer: Medicare Other | Admitting: Surgery

## 2011-12-18 ENCOUNTER — Encounter (INDEPENDENT_AMBULATORY_CARE_PROVIDER_SITE_OTHER): Payer: Self-pay | Admitting: Surgery

## 2011-12-18 VITALS — BP 132/84 | HR 84 | Temp 98.2°F | Resp 14 | Ht 66.5 in | Wt 239.8 lb

## 2011-12-18 DIAGNOSIS — D249 Benign neoplasm of unspecified breast: Secondary | ICD-10-CM | POA: Insufficient documentation

## 2011-12-18 NOTE — Progress Notes (Signed)
Chief Complaint:  Right ductal ectasia with discharge  History of Present Illness:  Penny Hernandez is an 71 y.o. female referred from [facility] for a nipple discharge and findings on ultrasound.  She is followed by Dr. Harris and Dr. Stringer. She had nipple discharge which led to her studies and on the ultrasound they thought there might be a 5 mm area within the duct it could be an intraductal papilloma contributing to the discharge.  On breast exam I was unable to express any discharge at this time. I did not feel any palpable masses in the retroareolar region. There is nothing suspicious about any of the findings there. No axillary adenopathy. Left side was a similar finding it to no palpable masses and no discharge.  Will likely need needle a right breast biopsy of this area. We'll set this up at [facility] and we'll schedule at her convenience.  Past Medical History  Diagnosis Date  . Ureteral calculus, left   . Diabetes mellitus     ORAL MED  . Asthma   . GERD (gastroesophageal reflux disease)     TAKES NEXIUM  . Urgency of urination OCCASIONAL  . History of kidney stones   . Joint pain OCCASIONAL    KNEES, LEGS, BACK  . Deafness congenital LEFT EAR  . 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 Chronic kidney disease 02/2011    kidney stone - left   . Hyperlipidemia   . Sleep apnea CPAP EVERY NIGHT    pt states "stopped 1 month ago"  . Arthritis     knees - no meds    Past Surgical History  Procedure Date  . Dilation and curettage of uterus YRS AGO    X2  . Ovarian cyst surgery YRS AGO  . Inner ear surgery LEFT -- YRS AGO    TO IMPROVE HEARING-- UNSUCCESSFUL  . Cataract extraction, bilateral     bilateral  . Ureteroscopy 03/06/2011    Procedure: URETEROSCOPY;  Surgeon: Mark C Ottelin, MD;  Location: [facility];  Service: Urology;  Laterality: Left;  C-arm, camera, Digital Ureteroscope, Holmium Laser needed  .  Cystoscopy/retrograde/ureteroscopy/stone extraction with basket 03/06/2011    Procedure: CYSTOSCOPY/RETROGRADE/URETEROSCOPY/STONE EXTRACTION WITH BASKET;  Surgeon: Mark C Ottelin, MD;  Location: [facility];  Service: Urology;  Laterality: Left;  . Upper gastrointestinal endoscopy 12/2003  . Colonoscopy 12/2003  . Svd     x 3    Current Outpatient Prescriptions  Medication Sig Dispense Refill  . albuterol (PROVENTIL HFA;VENTOLIN HFA) 108 (90 BASE) MCG/ACT inhaler Inhale 2 puffs into the lungs every 6 (six) hours as needed. For asthma      . cholecalciferol (VITAMIN D) 1000 UNITS tablet Take 1,000 Units by mouth daily.        . esomeprazole (NEXIUM) 40 MG capsule Take 40-80 mg by mouth daily. Sometimes takes 2 on some days for reflux      . Fluticasone-Salmeterol (ADVAIR) 250-50 MCG/DOSE AEPB Inhale 1 puff into the lungs as needed. Pt does not take this scheduled. Takes it as needed for asthma.      . metFORMIN (GLUCOPHAGE) 500 MG tablet Take 500 mg by mouth daily with breakfast.        . pravastatin (PRAVACHOL) 40 MG tablet Take 40 mg by mouth daily.       Review of patient's allergies indicates no known allergies. Family History  Problem   Relation Age of Onset  . Colon cancer Father   . Breast cancer Mother    Social History:   reports that she has never smoked. She has never used smokeless tobacco. She reports that she does not drink alcohol or use illicit drugs.   REVIEW OF SYSTEMS - PERTINENT POSITIVES ONLY: As above  Physical Exam:   Blood pressure 132/84, pulse 84, temperature 98.2 F (36.8 C), resp. rate 14, height 5' 6.5" (1.689 m), weight 239 lb 12.8 oz (108.773 kg). Body mass index is 38.13 kg/(m^2).  Gen:  WDWN AAF NAD  Neurological: Alert and oriented to person, place, and time. Motor and sensory function is grossly intact  Head: Normocephalic and atraumatic.  Eyes: Conjunctivae are normal. Pupils are equal, round, and reactive to light. No scleral  icterus.  Neck: Normal range of motion. Neck supple. No tracheal deviation or thyromegaly present. Breast:  No palpable masses and no demonstrable discharge.  The mammogram and ultrasound localized on the right.  Cardiovascular:  SR without murmurs or gallops.  No carotid bruits Respiratory: Effort normal.  No respiratory distress. No chest wall tenderness. Breath sounds normal.  No wheezes, rales or rhonchi.  Abdomen:  nontender  GU: Musculoskeletal: Normal range of motion. Extremities are nontender. No cyanosis, edema or clubbing noted Lymphadenopathy: No cervical, preauricular, postauricular or axillary adenopathy is present Skin: Skin is warm and dry. No rash noted. No diaphoresis. No erythema. No pallor. Pscyh: Normal mood and affect. Behavior is normal. Judgment and thought content normal.   LABORATORY RESULTS: No results found for this or any previous visit (from the past 48 hour(s)).  RADIOLOGY RESULTS: No results found.  Problem List: Patient Active Problem List  Diagnosis  . Pelvic pain in female  . Hydrosalpinx  . Hematochezia  . Obesity  . Cystocele  . S/P abdominal hysterectomy and left salpingo-oophorectomy  . Diabetes mellitus  . Hypercholesterolemia    Assessment & Plan: Right nipple discharge and 5 mm mass in duct.  Will needle loc at [facility] and do RBB    Matt B. Moses Odoherty, MD, FACS  [facility] [phone] beeper [phone]  12/18/2011 5:37 PM     

## 2011-12-18 NOTE — Patient Instructions (Addendum)
Thanks for your patience.  If you need further assistance after leaving the office, please call our office and speak with a [facility] nurse.  [phone].  If you want to leave a message for Dr. Brizeyda Holtmeyer, please call his office phone at [phone]. 

## 2012-01-04 NOTE — Progress Notes (Signed)
To come in for bmet-bring all meds and cpap dos

## 2012-01-05 ENCOUNTER — Encounter (HOSPITAL_BASED_OUTPATIENT_CLINIC_OR_DEPARTMENT_OTHER)
Admission: RE | Admit: 2012-01-05 | Discharge: 2012-01-05 | Disposition: A | Discharge status: Home / Self Care | Payer: Medicare Other | Source: Ambulatory Visit | Attending: Surgery | Admitting: Surgery

## 2012-01-05 DIAGNOSIS — E119 Type 2 diabetes mellitus without complications: Secondary | ICD-10-CM | POA: Diagnosis not present

## 2012-01-05 DIAGNOSIS — D249 Benign neoplasm of unspecified breast: Secondary | ICD-10-CM | POA: Diagnosis not present

## 2012-01-05 LAB — BASIC METABOLIC PANEL
BUN: 17 mg/dL (ref 6–23)
CO2: 29 mEq/L (ref 19–32)
Calcium: 9.9 mg/dL (ref 8.4–10.5)
Creatinine, Ser: 0.93 mg/dL (ref 0.50–1.10)
Glucose, Bld: 128 mg/dL — ABNORMAL HIGH (ref 70–99)

## 2012-01-06 ENCOUNTER — Encounter (HOSPITAL_BASED_OUTPATIENT_CLINIC_OR_DEPARTMENT_OTHER): Payer: Self-pay | Admitting: Anesthesiology

## 2012-01-06 ENCOUNTER — Encounter (HOSPITAL_BASED_OUTPATIENT_CLINIC_OR_DEPARTMENT_OTHER)
Admission: RE | Disposition: A | Discharge status: Home / Self Care | Payer: Self-pay | Source: Ambulatory Visit | Attending: Surgery

## 2012-01-06 ENCOUNTER — Encounter (HOSPITAL_BASED_OUTPATIENT_CLINIC_OR_DEPARTMENT_OTHER): Payer: Self-pay | Admitting: *Deleted

## 2012-01-06 ENCOUNTER — Ambulatory Visit (HOSPITAL_BASED_OUTPATIENT_CLINIC_OR_DEPARTMENT_OTHER)
Admission: RE | Admit: 2012-01-06 | Discharge: 2012-01-06 | Disposition: A | Discharge status: Home / Self Care | Payer: Medicare Other | Source: Ambulatory Visit | Attending: Surgery | Admitting: Surgery

## 2012-01-06 ENCOUNTER — Ambulatory Visit (HOSPITAL_BASED_OUTPATIENT_CLINIC_OR_DEPARTMENT_OTHER): Payer: Medicare Other | Admitting: Anesthesiology

## 2012-01-06 DIAGNOSIS — Z0189 Encounter for other specified special examinations: Secondary | ICD-10-CM | POA: Diagnosis not present

## 2012-01-06 DIAGNOSIS — E119 Type 2 diabetes mellitus without complications: Secondary | ICD-10-CM | POA: Insufficient documentation

## 2012-01-06 DIAGNOSIS — N6019 Diffuse cystic mastopathy of unspecified breast: Secondary | ICD-10-CM | POA: Diagnosis not present

## 2012-01-06 DIAGNOSIS — D249 Benign neoplasm of unspecified breast: Secondary | ICD-10-CM | POA: Insufficient documentation

## 2012-01-06 HISTORY — PX: BREAST BIOPSY: SHX20

## 2012-01-06 LAB — GLUCOSE, CAPILLARY: Glucose-Capillary: 129 mg/dL — ABNORMAL HIGH (ref 70–99)

## 2012-01-06 LAB — POCT HEMOGLOBIN-HEMACUE: Hemoglobin: 13.1 g/dL (ref 12.0–15.0)

## 2012-01-06 SURGERY — BREAST BIOPSY WITH NEEDLE LOCALIZATION
Anesthesia: General | Site: Breast | Laterality: Right | Wound class: Clean

## 2012-01-06 MED ORDER — SODIUM CHLORIDE 0.9 % IJ SOLN: 3.0000 mL | INTRAMUSCULAR | Status: DC | PRN

## 2012-01-06 MED ORDER — ACETAMINOPHEN 650 MG RE SUPP: 650.0000 mg | RECTAL | Status: DC | PRN

## 2012-01-06 MED ORDER — CHLORHEXIDINE GLUCONATE 4 % EX LIQD: 1.0000 "application " | Freq: Once | CUTANEOUS | Status: DC

## 2012-01-06 MED ORDER — CEFAZOLIN SODIUM-DEXTROSE 2-3 GM-% IV SOLR
2.0000 g | INTRAVENOUS | Status: AC
  Administered 2012-01-06: 2 g via INTRAVENOUS

## 2012-01-06 MED ORDER — OXYCODONE HCL 5 MG PO TABS: 5.0000 mg | ORAL_TABLET | ORAL | Status: DC | PRN

## 2012-01-06 MED ORDER — ACETAMINOPHEN 325 MG PO TABS: 650.0000 mg | ORAL_TABLET | ORAL | Status: DC | PRN

## 2012-01-06 MED ORDER — SODIUM CHLORIDE 0.9 % IJ SOLN: 3.0000 mL | Freq: Two times a day (BID) | INTRAMUSCULAR | Status: DC

## 2012-01-06 MED ORDER — MORPHINE SULFATE 2 MG/ML IJ SOLN: 1.0000 mg | INTRAMUSCULAR | Status: DC | PRN

## 2012-01-06 MED ORDER — HYDROMORPHONE HCL PF 1 MG/ML IJ SOLN: 0.2500 mg | INTRAMUSCULAR | Status: DC | PRN

## 2012-01-06 MED ORDER — BUPIVACAINE HCL (PF) 0.5 % IJ SOLN
INTRAMUSCULAR | Status: DC | PRN
  Administered 2012-01-06: 10 mL

## 2012-01-06 MED ORDER — LACTATED RINGERS IV SOLN
INTRAVENOUS | Status: DC
  Administered 2012-01-06 (×2): via INTRAVENOUS

## 2012-01-06 MED ORDER — ONDANSETRON HCL 4 MG/2ML IJ SOLN: 4.0000 mg | Freq: Four times a day (QID) | INTRAMUSCULAR | Status: DC | PRN

## 2012-01-06 MED ORDER — HEPARIN SODIUM (PORCINE) 5000 UNIT/ML IJ SOLN
5000.0000 [IU] | Freq: Once | INTRAMUSCULAR | Status: AC
  Administered 2012-01-06: 5000 [IU] via SUBCUTANEOUS

## 2012-01-06 MED ORDER — MIDAZOLAM HCL 2 MG/2ML IJ SOLN: 0.5000 mg | Freq: Once | INTRAMUSCULAR | Status: DC | PRN

## 2012-01-06 MED ORDER — PROPOFOL 10 MG/ML IV BOLUS
INTRAVENOUS | Status: DC | PRN
  Administered 2012-01-06: 200 mg via INTRAVENOUS

## 2012-01-06 MED ORDER — ONDANSETRON HCL 4 MG/2ML IJ SOLN
INTRAMUSCULAR | Status: DC | PRN
  Administered 2012-01-06: 4 mg via INTRAVENOUS

## 2012-01-06 MED ORDER — ACETAMINOPHEN 10 MG/ML IV SOLN
1000.0000 mg | Freq: Once | INTRAVENOUS | Status: AC
  Administered 2012-01-06: 1000 mg via INTRAVENOUS

## 2012-01-06 MED ORDER — FENTANYL CITRATE 0.05 MG/ML IJ SOLN
INTRAMUSCULAR | Status: DC | PRN
  Administered 2012-01-06 (×2): 50 ug via INTRAVENOUS

## 2012-01-06 MED ORDER — PROMETHAZINE HCL 25 MG/ML IJ SOLN: 6.2500 mg | INTRAMUSCULAR | Status: DC | PRN

## 2012-01-06 MED ORDER — SODIUM CHLORIDE 0.9 % IV SOLN: 250.0000 mL | INTRAVENOUS | Status: DC | PRN

## 2012-01-06 MED ORDER — MEPERIDINE HCL 25 MG/ML IJ SOLN: 6.2500 mg | INTRAMUSCULAR | Status: DC | PRN

## 2012-01-06 MED ORDER — OXYCODONE-ACETAMINOPHEN 5-325 MG PO TABS: 1.0000 | ORAL_TABLET | ORAL | Status: DC | PRN

## 2012-01-06 SURGICAL SUPPLY — 39 items
BANDAGE ELASTIC 6 VELCRO ST LF (GAUZE/BANDAGES/DRESSINGS) IMPLANT
BENZOIN TINCTURE PRP APPL 2/3 (GAUZE/BANDAGES/DRESSINGS) IMPLANT
BLADE SURG 15 STRL LF DISP TIS (BLADE) ×1 IMPLANT
BLADE SURG 15 STRL SS (BLADE) ×1
CANISTER SUCTION 1200CC (MISCELLANEOUS) ×2 IMPLANT
CLOTH BEACON ORANGE TIMEOUT ST (SAFETY) ×2 IMPLANT
COVER MAYO STAND STRL (DRAPES) ×2 IMPLANT
COVER TABLE BACK 60X90 (DRAPES) ×2 IMPLANT
DECANTER SPIKE VIAL GLASS SM (MISCELLANEOUS) IMPLANT
DERMABOND ADVANCED (GAUZE/BANDAGES/DRESSINGS) ×1
DERMABOND ADVANCED .7 DNX12 (GAUZE/BANDAGES/DRESSINGS) ×1 IMPLANT
DEVICE DUBIN W/COMP PLATE 8390 (MISCELLANEOUS) ×2 IMPLANT
DRAPE PED LAPAROTOMY (DRAPES) ×2 IMPLANT
ELECT COATED BLADE 2.86 ST (ELECTRODE) ×2 IMPLANT
ELECT REM PT RETURN 9FT ADLT (ELECTROSURGICAL) ×2
ELECTRODE REM PT RTRN 9FT ADLT (ELECTROSURGICAL) ×1 IMPLANT
GAUZE SPONGE 4X4 12PLY STRL LF (GAUZE/BANDAGES/DRESSINGS) ×2 IMPLANT
GAUZE SPONGE 4X4 16PLY XRAY LF (GAUZE/BANDAGES/DRESSINGS) ×2 IMPLANT
GLOVE BIO SURGEON STRL SZ8 (GLOVE) ×2 IMPLANT
GOWN PREVENTION PLUS XLARGE (GOWN DISPOSABLE) IMPLANT
GOWN PREVENTION PLUS XXLARGE (GOWN DISPOSABLE) ×4 IMPLANT
NEEDLE HYPO 25X1 1.5 SAFETY (NEEDLE) ×2 IMPLANT
NS IRRIG 1000ML POUR BTL (IV SOLUTION) ×2 IMPLANT
PACK BASIN DAY SURGERY FS (CUSTOM PROCEDURE TRAY) ×2 IMPLANT
PENCIL BUTTON HOLSTER BLD 10FT (ELECTRODE) ×2 IMPLANT
SLEEVE SCD COMPRESS KNEE MED (MISCELLANEOUS) ×2 IMPLANT
STRIP CLOSURE SKIN 1/2X4 (GAUZE/BANDAGES/DRESSINGS) IMPLANT
SUCTION FRAZIER TIP 10 FR DISP (SUCTIONS) ×2 IMPLANT
SUT SILK 2 0 SH (SUTURE) ×2 IMPLANT
SUT VIC AB 4-0 SH 18 (SUTURE) ×2 IMPLANT
SUT VIC AB 5-0 P-3 18X BRD (SUTURE) ×1 IMPLANT
SUT VIC AB 5-0 P3 18 (SUTURE) ×1
SYR BULB 3OZ (MISCELLANEOUS) ×2 IMPLANT
SYR CONTROL 10ML LL (SYRINGE) ×2 IMPLANT
TOWEL OR 17X24 6PK STRL BLUE (TOWEL DISPOSABLE) ×2 IMPLANT
TRAY DSU PREP LF (CUSTOM PROCEDURE TRAY) ×2 IMPLANT
TUBE CONNECTING 20X1/4 (TUBING) ×2 IMPLANT
WATER STERILE IRR 1000ML POUR (IV SOLUTION) IMPLANT
YANKAUER SUCT BULB TIP NO VENT (SUCTIONS) ×2 IMPLANT

## 2012-01-06 NOTE — Anesthesia Procedure Notes (Signed)
Procedure Name: LMA Insertion Date/Time: 01/06/2012 11:10 AM Performed by: Gar Gibbon Pre-anesthesia Checklist: Patient identified, Emergency Drugs available, Suction available and Patient being monitored Patient Re-evaluated:Patient Re-evaluated prior to inductionOxygen Delivery Method: Circle System Utilized Preoxygenation: Pre-oxygenation with 100% oxygen Intubation Type: IV induction Ventilation: Mask ventilation without difficulty LMA: LMA with gastric port inserted LMA Size: 4.0 Number of attempts: 1 Placement Confirmation: positive ETCO2 Tube secured with: Tape Dental Injury: Teeth and Oropharynx as per pre-operative assessment

## 2012-01-06 NOTE — Interval H&P Note (Signed)
History and Physical Interval Note:  01/06/2012 10:40 AM  Penny Hernandez  has presented today for surgery, with the diagnosis of Right intraductal papilloma  The various methods of treatment have been discussed with the patient and family. After consideration of risks, benefits and other options for treatment, the patient has consented to  Procedure(s) (LRB) with comments: BREAST BIOPSY WITH NEEDLE LOCALIZATION (Right) - Right needle localization breast biospy as a surgical intervention .  The patient's history has been reviewed, patient examined, no change in status, stable for surgery.  I have reviewed the patient's chart and labs.  Questions were answered to the patient's satisfaction. Needle loc placed in the right breast.  Will proceed with retroareolar excision    Itzelle Gains B

## 2012-01-06 NOTE — Progress Notes (Signed)
Hearing aid placed in rt ear by pt.

## 2012-01-06 NOTE — Transfer of Care (Signed)
Immediate Anesthesia Transfer of Care Note  Patient: Penny Hernandez  Procedure(s) Performed: Procedure(s) (LRB) with comments: BREAST BIOPSY WITH NEEDLE LOCALIZATION (Right) - Right needle localization breast biospy  Patient Location: PACU  Anesthesia Type: General  Level of Consciousness: sedated and patient cooperative  Airway & Oxygen Therapy: Patient Spontanous Breathing and Patient connected to face mask oxygen  Post-op Assessment: Report given to PACU RN and Post -op Vital signs reviewed and stable  Post vital signs: Reviewed and stable  Complications: No apparent anesthesia complications

## 2012-01-06 NOTE — H&P (View-Only) (Signed)
Chief Complaint:  Right ductal ectasia with discharge  History of Present Illness:  Penny Hernandez is an 72 y.o. female referred from [facility] for a nipple discharge and findings on ultrasound.  She is followed by Dr. Tiburcio Pea and Dr. Stefano Gaul. She had nipple discharge which led to her studies and on the ultrasound they thought there might be a 5 mm area within the duct it could be an intraductal papilloma contributing to the discharge.  On breast exam I was unable to express any discharge at this time. I did not feel any palpable masses in the retroareolar region. There is nothing suspicious about any of the findings there. No axillary adenopathy. Left side was a similar finding it to no palpable masses and no discharge.  Will likely need needle a right breast biopsy of this area. We'll set this up at [facility] and we'll schedule at her convenience.  Past Medical History  Diagnosis Date  . Ureteral calculus, left   . Diabetes mellitus     ORAL MED  . Asthma   . GERD (gastroesophageal reflux disease)     TAKES NEXIUM  . Urgency of urination OCCASIONAL  . History of kidney stones   . Joint pain OCCASIONAL    KNEES, LEGS, BACK  . Deafness congenital LEFT EAR  . 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 Chronic kidney disease 02/2011    kidney stone - left   . Hyperlipidemia   . Sleep apnea CPAP EVERY NIGHT    pt states "stopped 1 month ago"  . Arthritis     knees - no meds    Past Surgical History  Procedure Date  . Dilation and curettage of uterus YRS AGO    X2  . Ovarian cyst surgery YRS AGO  . Inner ear surgery LEFT -- YRS AGO    TO IMPROVE HEARING-- UNSUCCESSFUL  . Cataract extraction, bilateral     bilateral  . Ureteroscopy 03/06/2011    Procedure: URETEROSCOPY;  Surgeon: Garnett Farm, MD;  Location: [facility];  Service: Urology;  Laterality: Left;  C-arm, camera, Digital Ureteroscope, Holmium Laser needed  .  Cystoscopy/retrograde/ureteroscopy/stone extraction with basket 03/06/2011    Procedure: CYSTOSCOPY/RETROGRADE/URETEROSCOPY/STONE EXTRACTION WITH BASKET;  Surgeon: Garnett Farm, MD;  Location: [facility];  Service: Urology;  Laterality: Left;  . Upper gastrointestinal endoscopy 12/2003  . Colonoscopy 12/2003  . Svd     x 3    Current Outpatient Prescriptions  Medication Sig Dispense Refill  . albuterol (PROVENTIL HFA;VENTOLIN HFA) 108 (90 BASE) MCG/ACT inhaler Inhale 2 puffs into the lungs every 6 (six) hours as needed. For asthma      . cholecalciferol (VITAMIN D) 1000 UNITS tablet Take 1,000 Units by mouth daily.        Marland Kitchen esomeprazole (NEXIUM) 40 MG capsule Take 40-80 mg by mouth daily. Sometimes takes 2 on some days for reflux      . Fluticasone-Salmeterol (ADVAIR) 250-50 MCG/DOSE AEPB Inhale 1 puff into the lungs as needed. Pt does not take this scheduled. Takes it as needed for asthma.      . metFORMIN (GLUCOPHAGE) 500 MG tablet Take 500 mg by mouth daily with breakfast.        . pravastatin (PRAVACHOL) 40 MG tablet Take 40 mg by mouth daily.       Review of patient's allergies indicates no known allergies. Family History  Problem  Relation Age of Onset  . Colon cancer Father   . Breast cancer Mother    Social History:   reports that she has never smoked. She has never used smokeless tobacco. She reports that she does not drink alcohol or use illicit drugs.   REVIEW OF SYSTEMS - PERTINENT POSITIVES ONLY: As above  Physical Exam:   Blood pressure 132/84, pulse 84, temperature 98.2 F (36.8 C), resp. rate 14, height 5' 6.5" (1.689 m), weight 239 lb 12.8 oz (108.773 kg). Body mass index is 38.13 kg/(m^2).  Gen:  WDWN AAF NAD  Neurological: Alert and oriented to person, place, and time. Motor and sensory function is grossly intact  Head: Normocephalic and atraumatic.  Eyes: Conjunctivae are normal. Pupils are equal, round, and reactive to light. No scleral  icterus.  Neck: Normal range of motion. Neck supple. No tracheal deviation or thyromegaly present. Breast:  No palpable masses and no demonstrable discharge.  The mammogram and ultrasound localized on the right.  Cardiovascular:  SR without murmurs or gallops.  No carotid bruits Respiratory: Effort normal.  No respiratory distress. No chest wall tenderness. Breath sounds normal.  No wheezes, rales or rhonchi.  Abdomen:  nontender  GU: Musculoskeletal: Normal range of motion. Extremities are nontender. No cyanosis, edema or clubbing noted Lymphadenopathy: No cervical, preauricular, postauricular or axillary adenopathy is present Skin: Skin is warm and dry. No rash noted. No diaphoresis. No erythema. No pallor. Pscyh: Normal mood and affect. Behavior is normal. Judgment and thought content normal.   LABORATORY RESULTS: No results found for this or any previous visit (from the past 48 hour(s)).  RADIOLOGY RESULTS: No results found.  Problem List: Patient Active Problem List  Diagnosis  . Pelvic pain in female  . Hydrosalpinx  . Hematochezia  . Obesity  . Cystocele  . S/P abdominal hysterectomy and left salpingo-oophorectomy  . Diabetes mellitus  . Hypercholesterolemia    Assessment & Plan: Right nipple discharge and 5 mm mass in duct.  Will needle loc at [facility] and do RBB    Matt B. Daphine Deutscher, MD, [facility] [phone] beeper [phone]  12/18/2011 5:37 PM

## 2012-01-06 NOTE — Anesthesia Postprocedure Evaluation (Signed)
  Anesthesia Post-op Note  Patient: Penny Hernandez  Procedure(s) Performed: Procedure(s) (LRB) with comments: BREAST BIOPSY WITH NEEDLE LOCALIZATION (Right) - Right needle localization breast biospy  Patient Location: PACU  Anesthesia Type: General  Level of Consciousness: awake, alert , oriented and patient cooperative  Airway and Oxygen Therapy: Patient Spontanous Breathing  Post-op Pain: none  Post-op Assessment: Post-op Vital signs reviewed, Patient's Cardiovascular Status Stable, Respiratory Function Stable, Patent Airway, No signs of Nausea or vomiting and Pain level controlled  Post-op Vital Signs: Reviewed and stable  Complications: No apparent anesthesia complications

## 2012-01-06 NOTE — Op Note (Signed)
Surgeon: Wenda Low, MD, FACS  Asst:  none  Anes:  general  Procedure: Needle localized right breast biopsy  Diagnosis: Path pending  Complications: none  EBL:   30 cc  Description of Procedure:  Needle localization at [facility].  Patient had a needle localization as well as the right side. She had a complex ductal system in the right breast. Wire was placed fairly shallowly beneath the). Fortunately at the operating table I was able to see the duct that was draining and cannulated it with a duct probe. I made a circumareolar incision medially elevating the areola and excised the retroareolar tissue that was serviced by this draining duct. There was mucus and debris in some of these dilated ducts were removed in toto in addition found a firm area more deep and posterior which I excised and sent as a separate specimen. There is really nothing that we could see on the needle localization post-mammogram syndesmosis findings were ultrasound driven. The wound was carefully inspected irrigated. Hemostasis was achieved with 4-0 Vicryl figure-of-eight sutures and electrocautery. Was injected with Marcaine and closed 4-0 Vicryl with a Dermabond. A compression dressing was applied. Patient was taken to the recovery room in satisfactory condition will go home.  Matt B. Daphine Deutscher, MD, [facility], [place] [phone]

## 2012-01-06 NOTE — Anesthesia Preprocedure Evaluation (Addendum)
Anesthesia Evaluation  Patient identified by MRN, date of birth, ID band Patient awake    Reviewed: Allergy & Precautions, H&P , NPO status , Patient's Chart, lab work & pertinent test results  History of Anesthesia Complications Negative for: history of anesthetic complications  Airway Mallampati: II TM Distance: >3 FB Neck ROM: Full    Dental  (+) Teeth Intact and Dental Advisory Given   Pulmonary asthma (well controlled) , sleep apnea and Continuous Positive Airway Pressure Ventilation ,  breath sounds clear to auscultation  Pulmonary exam normal       Cardiovascular negative cardio ROS  Rhythm:Regular Rate:Normal     Neuro/Psych negative neurological ROS     GI/Hepatic Neg liver ROS, GERD-  Medicated and Controlled,  Endo/Other  diabetes (glu 129), Well Controlled, Type 2, Oral Hypoglycemic AgentsMorbid obesity  Renal/GU Renal disease     Musculoskeletal   Abdominal (+) + obese,   Peds  Hematology negative hematology ROS (+)   Anesthesia Other Findings   Reproductive/Obstetrics                          Anesthesia Physical Anesthesia Plan  ASA: III  Anesthesia Plan: General   Post-op Pain Management:    Induction: Intravenous  Airway Management Planned: LMA  Additional Equipment:   Intra-op Plan:   Post-operative Plan:   Informed Consent: I have reviewed the patients History and Physical, chart, labs and discussed the procedure including the risks, benefits and alternatives for the proposed anesthesia with the patient or authorized representative who has indicated his/her understanding and acceptance.   Dental advisory given  Plan Discussed with: CRNA and Surgeon  Anesthesia Plan Comments: (Plan routine monitors, GA- LMA OK)        Anesthesia Quick Evaluation

## 2012-01-07 ENCOUNTER — Encounter (HOSPITAL_BASED_OUTPATIENT_CLINIC_OR_DEPARTMENT_OTHER): Payer: Self-pay | Admitting: Surgery

## 2012-01-11 ENCOUNTER — Telehealth (INDEPENDENT_AMBULATORY_CARE_PROVIDER_SITE_OTHER): Payer: Self-pay | Admitting: General Surgery

## 2012-01-11 NOTE — Telephone Encounter (Signed)
Message copied by Littie Deeds on Mon Jan 11, 2012  4:35 PM ------      Message from: June Leap      Created: Mon Jan 11, 2012  3:53 PM       Could you please call and give patient a po appt for Rt br bx...she didn't call in, I was working on the list at [phone] and couldn't find anything...thx

## 2012-01-11 NOTE — Telephone Encounter (Signed)
Spoke with Penny Hernandez and informed her that she would have her first PO appt w/ Dr. Kerry Fort on 10/16 at 2:10.  I also informed her that I would have Dr. Kerry Fort review her pathology.

## 2012-01-14 ENCOUNTER — Telehealth (INDEPENDENT_AMBULATORY_CARE_PROVIDER_SITE_OTHER): Payer: Self-pay | Admitting: General Surgery

## 2012-01-14 NOTE — Telephone Encounter (Signed)
Called pt and informed her that her pathology cam back as an intraductal papilloma.  She was pleased with this.

## 2012-01-18 DIAGNOSIS — R1310 Dysphagia, unspecified: Secondary | ICD-10-CM | POA: Diagnosis not present

## 2012-01-20 ENCOUNTER — Encounter (INDEPENDENT_AMBULATORY_CARE_PROVIDER_SITE_OTHER): Payer: Self-pay | Admitting: Surgery

## 2012-01-20 ENCOUNTER — Ambulatory Visit (INDEPENDENT_AMBULATORY_CARE_PROVIDER_SITE_OTHER): Payer: Medicare Other | Admitting: Surgery

## 2012-01-20 VITALS — BP 132/82 | HR 85 | Temp 98.0°F | Resp 18 | Ht 66.0 in | Wt 240.0 lb

## 2012-01-20 DIAGNOSIS — D249 Benign neoplasm of unspecified breast: Secondary | ICD-10-CM

## 2012-01-20 NOTE — Progress Notes (Signed)
1. Breast, lumpectomy, Right - INTRADUCTAL PAPILLOMA SEE COMMENT. 2. Breast, excision, Right - INTRADUCTAL PAPILLOMA WITH ASSOCIATED FIBROCYSTIC CHANGE, SEE COMMENT.  Penny Hernandez 71 y.o.  Body mass index is 38.74 kg/(m^2).  Patient Active Problem List  Diagnosis  . Pelvic pain in female  . Hydrosalpinx  . Hematochezia  . Obesity  . Cystocele  . S/P abdominal hysterectomy and left salpingo-oophorectomy  . Diabetes mellitus  . Hypercholesterolemia  . Papilloma of breast    No Known Allergies  Past Surgical History  Procedure Date  . Dilation and curettage of uterus YRS AGO    X2  . Ovarian cyst surgery YRS AGO  . Inner ear surgery LEFT -- YRS AGO    TO IMPROVE HEARING-- UNSUCCESSFUL  . Cataract extraction, bilateral     bilateral  . Ureteroscopy 03/06/2011    Procedure: URETEROSCOPY;  Surgeon: Garnett Farm, MD;  Location: [facility];  Service: Urology;  Laterality: Left;  C-arm, camera, Digital Ureteroscope, Holmium Laser needed  . Cystoscopy/retrograde/ureteroscopy/stone extraction with basket 03/06/2011    Procedure: CYSTOSCOPY/RETROGRADE/URETEROSCOPY/STONE EXTRACTION WITH BASKET;  Surgeon: Garnett Farm, MD;  Location: [facility];  Service: Urology;  Laterality: Left;  . Upper gastrointestinal endoscopy 12/2003  . Colonoscopy 12/2003  . Svd     x 3  . Breast biopsy 01/06/2012    Procedure: BREAST BIOPSY WITH NEEDLE LOCALIZATION;  Surgeon: Valarie Merino, MD;  Location: [facility];  Service: General;  Laterality: Right;  Right needle localization breast biospy   [name], MD No diagnosis found.  Incision looks good.  Intraductal papilloma.  Copy of path report given to the patient.  I took care of her husband Eriko Economos who died of pancreatic cancer 7 years ago.   Matt B. Daphine Deutscher, MD, [facility] [phone] beeper [phone]  01/20/2012 2:05 PM

## 2012-01-20 NOTE — Patient Instructions (Addendum)
Thanks for your patience.  If you need further assistance after leaving the office, please call our office and speak with a [facility] nurse.  [phone].  If you want to leave a message for Dr. Hulbert Branscome, please call his office phone at [phone]. 

## 2012-01-28 ENCOUNTER — Encounter: Payer: Self-pay | Admitting: Obstetrics and Gynecology

## 2012-01-28 ENCOUNTER — Ambulatory Visit (INDEPENDENT_AMBULATORY_CARE_PROVIDER_SITE_OTHER): Payer: Medicare Other | Admitting: Obstetrics and Gynecology

## 2012-01-28 VITALS — BP 110/64 | Wt 242.0 lb

## 2012-01-28 DIAGNOSIS — N7011 Chronic salpingitis: Secondary | ICD-10-CM

## 2012-01-28 DIAGNOSIS — N7013 Chronic salpingitis and oophoritis: Secondary | ICD-10-CM

## 2012-01-28 DIAGNOSIS — Z719 Counseling, unspecified: Secondary | ICD-10-CM

## 2012-01-28 NOTE — Progress Notes (Signed)
HISTORY OF PRESENT ILLNESS  [name] is a 71 y.o. year old female,G3P3, who presents for a problem visit. The patient is status post hysterectomy and left salpingo-oophorectomy.  She was evaluated for abdominal pain and was found to have a cystic mass in the right adnexa that was most consistent with a hydrosalpinx.  Subjective:  The patient complains of a vague right lower quadrant discomfort.  Objective:  BP 110/64  Wt 242 lb (109.77 kg)   General: no distress Resp: clear to auscultation bilaterally Cardio: regular rate and rhythm, S1, S2 normal, no murmur, click, rub or gallop GI: soft and nontender  Exam deferred.  Assessment:  Right hydrosalpinx Right lower quadrant pain  Plan:  Pelvic ultrasound  Return to office prn if symptoms worsen or fail to improve.   Leonard Schwartz M.D.  01/28/2012 6:32 PM

## 2012-02-08 ENCOUNTER — Ambulatory Visit (INDEPENDENT_AMBULATORY_CARE_PROVIDER_SITE_OTHER): Payer: Medicare Other

## 2012-02-08 ENCOUNTER — Ambulatory Visit (INDEPENDENT_AMBULATORY_CARE_PROVIDER_SITE_OTHER): Payer: Medicare Other | Admitting: Obstetrics and Gynecology

## 2012-02-08 ENCOUNTER — Encounter: Payer: Self-pay | Admitting: Obstetrics and Gynecology

## 2012-02-08 VITALS — BP 136/68 | Temp 98.4°F | Resp 18 | Ht 66.5 in | Wt 243.0 lb

## 2012-02-08 DIAGNOSIS — N7013 Chronic salpingitis and oophoritis: Secondary | ICD-10-CM

## 2012-02-08 DIAGNOSIS — R32 Unspecified urinary incontinence: Secondary | ICD-10-CM

## 2012-02-08 DIAGNOSIS — E119 Type 2 diabetes mellitus without complications: Secondary | ICD-10-CM | POA: Diagnosis not present

## 2012-02-08 DIAGNOSIS — N7011 Chronic salpingitis: Secondary | ICD-10-CM

## 2012-02-08 NOTE — Progress Notes (Signed)
HISTORY OF PRESENT ILLNESS  [name] is a 71 y.o. year old female,G3P3, who presents for a problem visit. The patient complains of a vague right lower quadrant pain.  She is status post hysterectomy and left salpingo-oophorectomy.  She was found to have a cystic structure in the right adnexa that was consistent with a hydrosalpinx.  A CA 125 was normal. The patient has had a bladder suspension procedure.  Subjective:  The patient complains of a vague right lower quadrant pain.  She also complains of urinary incontinence. The patient has diabetes and she admits that her sugars have not been under good control.  Objective:  BP 136/68  Temp 98.4 F (36.9 C) (Oral)  Resp 18  Ht 5' 6.5" (1.689 m)  Wt 243 lb (110.224 kg)  BMI 38.63 kg/m2   GI: soft and nontender  Exam deferred.  Ultrasound: Uterus is absent.  Ovaries not identified.  Simple cystic tubular structure in the right adnexa measures 3.7 x 3.1 cm.  A simple cyst in the midline measures 5.5 x 3.5 cm.  No nodularity is noted.  Assessment:  Hydrosalpinx on the right. Vague right lower quadrant pain. Diabetes not well controlled. Urinary incontinence.  Plan:  A long discussion with her once again about the hydrosalpinx.  We discussed the risk and benefits of surgical evaluation.  We discussed my concerns about adhesion formation.  My recommendation is that we continue to observe only for now.  The patient agrees.  The patient will try to improve her exercising and improve her blood sugars.  Evaluation and management of urinary incontinence after a bladder suspension procedure were reviewed.  The patient will return in 4 weeks to that we can discuss further.  Return to office in 4 week(s).  25 min. Visit with greater than 50% being face-to-face.  Leonard Schwartz M.D.  02/08/2012 7:32 PM    Vag.  Discharge:no Odor:no Fever:no Irreg.Periods:no Dyspareunia:no Dysuria:no Frequency:yes Urgency:yes Hematuria:no Kidney stones:no Constipation:no Diarrhea:no Rectal Bleeding: no Vomiting:no Nausea:yes Pregnant:no Fibroids:no Endometriosis:no Hx of Ovarian Cyst:no Hx IUD:no Hx STD-PID:no Appendectomy:no Gall Bladder Dz:no

## 2012-02-23 DIAGNOSIS — E559 Vitamin D deficiency, unspecified: Secondary | ICD-10-CM | POA: Diagnosis not present

## 2012-02-23 DIAGNOSIS — J45909 Unspecified asthma, uncomplicated: Secondary | ICD-10-CM | POA: Diagnosis not present

## 2012-02-23 DIAGNOSIS — R079 Chest pain, unspecified: Secondary | ICD-10-CM | POA: Diagnosis not present

## 2012-02-23 DIAGNOSIS — E1142 Type 2 diabetes mellitus with diabetic polyneuropathy: Secondary | ICD-10-CM | POA: Diagnosis not present

## 2012-02-23 DIAGNOSIS — Z0000 Encounter for general adult medical examination without abnormal findings: Secondary | ICD-10-CM | POA: Diagnosis not present

## 2012-02-23 DIAGNOSIS — E1149 Type 2 diabetes mellitus with other diabetic neurological complication: Secondary | ICD-10-CM | POA: Diagnosis not present

## 2012-02-23 DIAGNOSIS — K219 Gastro-esophageal reflux disease without esophagitis: Secondary | ICD-10-CM | POA: Diagnosis not present

## 2012-02-23 DIAGNOSIS — E785 Hyperlipidemia, unspecified: Secondary | ICD-10-CM | POA: Diagnosis not present

## 2012-03-15 DIAGNOSIS — M79609 Pain in unspecified limb: Secondary | ICD-10-CM | POA: Diagnosis not present

## 2012-03-15 DIAGNOSIS — B351 Tinea unguium: Secondary | ICD-10-CM | POA: Diagnosis not present

## 2012-06-24 DIAGNOSIS — M79609 Pain in unspecified limb: Secondary | ICD-10-CM | POA: Diagnosis not present

## 2012-06-24 DIAGNOSIS — B351 Tinea unguium: Secondary | ICD-10-CM | POA: Diagnosis not present

## 2012-07-11 DIAGNOSIS — E785 Hyperlipidemia, unspecified: Secondary | ICD-10-CM | POA: Diagnosis not present

## 2012-07-11 DIAGNOSIS — E1149 Type 2 diabetes mellitus with other diabetic neurological complication: Secondary | ICD-10-CM | POA: Diagnosis not present

## 2012-07-11 DIAGNOSIS — R42 Dizziness and giddiness: Secondary | ICD-10-CM | POA: Diagnosis not present

## 2012-07-21 DIAGNOSIS — R1310 Dysphagia, unspecified: Secondary | ICD-10-CM | POA: Diagnosis not present

## 2012-07-21 DIAGNOSIS — K219 Gastro-esophageal reflux disease without esophagitis: Secondary | ICD-10-CM | POA: Diagnosis not present

## 2012-08-01 DIAGNOSIS — R42 Dizziness and giddiness: Secondary | ICD-10-CM | POA: Diagnosis not present

## 2012-09-28 DIAGNOSIS — E119 Type 2 diabetes mellitus without complications: Secondary | ICD-10-CM | POA: Diagnosis not present

## 2012-09-28 DIAGNOSIS — I889 Nonspecific lymphadenitis, unspecified: Secondary | ICD-10-CM | POA: Diagnosis not present

## 2012-09-28 DIAGNOSIS — H8110 Benign paroxysmal vertigo, unspecified ear: Secondary | ICD-10-CM | POA: Diagnosis not present

## 2012-09-28 DIAGNOSIS — E559 Vitamin D deficiency, unspecified: Secondary | ICD-10-CM | POA: Diagnosis not present

## 2012-10-03 DIAGNOSIS — M779 Enthesopathy, unspecified: Secondary | ICD-10-CM | POA: Diagnosis not present

## 2012-10-03 DIAGNOSIS — B351 Tinea unguium: Secondary | ICD-10-CM | POA: Diagnosis not present

## 2012-12-13 DIAGNOSIS — N644 Mastodynia: Secondary | ICD-10-CM | POA: Diagnosis not present

## 2013-01-10 DIAGNOSIS — J45909 Unspecified asthma, uncomplicated: Secondary | ICD-10-CM | POA: Diagnosis not present

## 2013-01-10 DIAGNOSIS — K219 Gastro-esophageal reflux disease without esophagitis: Secondary | ICD-10-CM | POA: Diagnosis not present

## 2013-01-10 DIAGNOSIS — E1149 Type 2 diabetes mellitus with other diabetic neurological complication: Secondary | ICD-10-CM | POA: Diagnosis not present

## 2013-01-10 DIAGNOSIS — E559 Vitamin D deficiency, unspecified: Secondary | ICD-10-CM | POA: Diagnosis not present

## 2013-01-10 DIAGNOSIS — E1142 Type 2 diabetes mellitus with diabetic polyneuropathy: Secondary | ICD-10-CM | POA: Diagnosis not present

## 2013-01-10 DIAGNOSIS — E785 Hyperlipidemia, unspecified: Secondary | ICD-10-CM | POA: Diagnosis not present

## 2013-03-22 DIAGNOSIS — E1142 Type 2 diabetes mellitus with diabetic polyneuropathy: Secondary | ICD-10-CM | POA: Diagnosis not present

## 2013-03-22 DIAGNOSIS — L259 Unspecified contact dermatitis, unspecified cause: Secondary | ICD-10-CM | POA: Diagnosis not present

## 2013-03-22 DIAGNOSIS — R3915 Urgency of urination: Secondary | ICD-10-CM | POA: Diagnosis not present

## 2013-03-22 DIAGNOSIS — E1149 Type 2 diabetes mellitus with other diabetic neurological complication: Secondary | ICD-10-CM | POA: Diagnosis not present

## 2013-03-22 DIAGNOSIS — H8110 Benign paroxysmal vertigo, unspecified ear: Secondary | ICD-10-CM | POA: Diagnosis not present

## 2013-03-22 DIAGNOSIS — K219 Gastro-esophageal reflux disease without esophagitis: Secondary | ICD-10-CM | POA: Diagnosis not present

## 2013-03-27 ENCOUNTER — Ambulatory Visit (INDEPENDENT_AMBULATORY_CARE_PROVIDER_SITE_OTHER): Payer: Medicare Other | Admitting: Podiatry

## 2013-03-27 ENCOUNTER — Encounter: Payer: Self-pay | Admitting: Podiatry

## 2013-03-27 VITALS — BP 137/75 | HR 84 | Resp 16 | Ht 66.0 in | Wt 239.0 lb

## 2013-03-27 DIAGNOSIS — M79609 Pain in unspecified limb: Secondary | ICD-10-CM | POA: Diagnosis not present

## 2013-03-27 DIAGNOSIS — B351 Tinea unguium: Secondary | ICD-10-CM | POA: Diagnosis not present

## 2013-03-27 NOTE — Progress Notes (Signed)
Patient ID: Penny Hernandez, female   DOB: [birth year], 72 y.o.   MRN: [id number]  Subjective: Orientated x83 72 year old black female known diabetic presents for debridement of painful mycotic toenails. She is a patient in this practice since June 2011. Last visit for this service was 06/24/2012  Objective: Incurvated, discolored, elongated, hypertrophic toenails x10.  Assessment: Symptomatic onychomycoses x10  Plan: All 10 toenails are debrided back without any bleeding. Reappoint at three-month intervals.

## 2013-06-19 ENCOUNTER — Encounter: Payer: Self-pay | Admitting: Podiatry

## 2013-06-19 ENCOUNTER — Ambulatory Visit (INDEPENDENT_AMBULATORY_CARE_PROVIDER_SITE_OTHER): Payer: Medicare Other | Admitting: Podiatry

## 2013-06-19 VITALS — BP 140/63 | HR 87 | Resp 18

## 2013-06-19 DIAGNOSIS — B351 Tinea unguium: Secondary | ICD-10-CM | POA: Diagnosis not present

## 2013-06-19 DIAGNOSIS — M79609 Pain in unspecified limb: Secondary | ICD-10-CM

## 2013-06-20 NOTE — Progress Notes (Signed)
Patient ID: Penny Hernandez, female   DOB: 12/13/1940, 73 y.o.   MRN: [id number]  Subjective: Orientated x49 73 year old black female known diabetic presents for debridement of painful mycotic toenails. She is a patient in this practice since June 2011.The last visit for this service was 03/27/2013  Objective: Incurvated, discolored, elongated, hypertrophic toenails x10.   Assessment: Symptomatic onychomycoses x10   Plan: All 10 toenails are debrided back without any bleeding. Reappoint at three-month intervals.

## 2013-09-25 ENCOUNTER — Ambulatory Visit: Payer: Medicare Other | Admitting: Podiatry

## 2013-09-26 ENCOUNTER — Ambulatory Visit
Admission: RE | Admit: 2013-09-26 | Discharge: 2013-09-26 | Disposition: A | Discharge status: Home / Self Care | Payer: Medicare Other | Source: Ambulatory Visit | Attending: Family Medicine | Admitting: Family Medicine

## 2013-09-26 ENCOUNTER — Other Ambulatory Visit: Payer: Self-pay | Admitting: Family Medicine

## 2013-09-26 DIAGNOSIS — E119 Type 2 diabetes mellitus without complications: Secondary | ICD-10-CM | POA: Diagnosis not present

## 2013-09-26 DIAGNOSIS — E1142 Type 2 diabetes mellitus with diabetic polyneuropathy: Secondary | ICD-10-CM | POA: Diagnosis not present

## 2013-09-26 DIAGNOSIS — R0602 Shortness of breath: Secondary | ICD-10-CM

## 2013-09-26 DIAGNOSIS — H8110 Benign paroxysmal vertigo, unspecified ear: Secondary | ICD-10-CM | POA: Diagnosis not present

## 2013-09-26 DIAGNOSIS — K219 Gastro-esophageal reflux disease without esophagitis: Secondary | ICD-10-CM | POA: Diagnosis not present

## 2013-09-26 DIAGNOSIS — E785 Hyperlipidemia, unspecified: Secondary | ICD-10-CM | POA: Diagnosis not present

## 2013-09-26 DIAGNOSIS — R079 Chest pain, unspecified: Secondary | ICD-10-CM | POA: Diagnosis not present

## 2013-09-26 DIAGNOSIS — E559 Vitamin D deficiency, unspecified: Secondary | ICD-10-CM | POA: Diagnosis not present

## 2013-10-23 ENCOUNTER — Encounter: Payer: Self-pay | Admitting: Podiatry

## 2013-10-23 ENCOUNTER — Ambulatory Visit (INDEPENDENT_AMBULATORY_CARE_PROVIDER_SITE_OTHER): Payer: Medicare Other | Admitting: Podiatry

## 2013-10-23 VITALS — BP 146/72 | HR 94 | Resp 18

## 2013-10-23 DIAGNOSIS — B351 Tinea unguium: Secondary | ICD-10-CM

## 2013-10-23 DIAGNOSIS — M79609 Pain in unspecified limb: Secondary | ICD-10-CM | POA: Diagnosis not present

## 2013-10-23 DIAGNOSIS — M79673 Pain in unspecified foot: Secondary | ICD-10-CM

## 2013-10-24 NOTE — Progress Notes (Signed)
Patient ID: Penny Hernandez, female   DOB: 03-04-1941, 73 y.o.   MRN: [id number]  Subjective: Orientated x3 black female presents complaining of painful toenails  Objective: Hypertrophic, incurvated, discolored toenails 6-10  Assessment: Symptomatic onychomycoses 6-10  Plan: Debrided toenails x10 without a bleeding  Reappoint x3 months

## 2014-01-22 ENCOUNTER — Ambulatory Visit (INDEPENDENT_AMBULATORY_CARE_PROVIDER_SITE_OTHER): Payer: Medicare Other | Admitting: Podiatry

## 2014-01-22 DIAGNOSIS — M79676 Pain in unspecified toe(s): Secondary | ICD-10-CM

## 2014-01-22 DIAGNOSIS — B351 Tinea unguium: Secondary | ICD-10-CM

## 2014-01-23 NOTE — Progress Notes (Signed)
Patient ID: Penny Hernandez, female   DOB: [birth year], 73 y.o.   MRN: [id number]  Subjective: This patient presents again complaining of painful toenails  Objective: Orientated x3 Elongated, hypertrophic, incurvated toenails 6-10  Assessment: Symptomatic onychomycoses 6-10  Plan: Debrided toenails x10 without a bleeding  Reappoint x3 months

## 2014-02-05 ENCOUNTER — Encounter: Payer: Self-pay | Admitting: Podiatry

## 2014-03-08 DIAGNOSIS — N899 Noninflammatory disorder of vagina, unspecified: Secondary | ICD-10-CM | POA: Diagnosis not present

## 2014-03-08 DIAGNOSIS — Z1231 Encounter for screening mammogram for malignant neoplasm of breast: Secondary | ICD-10-CM | POA: Diagnosis not present

## 2014-03-08 DIAGNOSIS — R350 Frequency of micturition: Secondary | ICD-10-CM | POA: Diagnosis not present

## 2014-03-08 DIAGNOSIS — Z01411 Encounter for gynecological examination (general) (routine) with abnormal findings: Secondary | ICD-10-CM | POA: Diagnosis not present

## 2014-03-08 DIAGNOSIS — R102 Pelvic and perineal pain: Secondary | ICD-10-CM | POA: Diagnosis not present

## 2014-03-21 ENCOUNTER — Other Ambulatory Visit: Payer: Self-pay | Admitting: Gastroenterology

## 2014-03-21 DIAGNOSIS — R1310 Dysphagia, unspecified: Secondary | ICD-10-CM

## 2014-03-21 DIAGNOSIS — Z8371 Family history of colonic polyps: Secondary | ICD-10-CM | POA: Diagnosis not present

## 2014-03-23 DIAGNOSIS — Z1231 Encounter for screening mammogram for malignant neoplasm of breast: Secondary | ICD-10-CM | POA: Diagnosis not present

## 2014-03-27 ENCOUNTER — Other Ambulatory Visit: Payer: Medicare Other

## 2014-03-27 ENCOUNTER — Ambulatory Visit
Admission: RE | Admit: 2014-03-27 | Discharge: 2014-03-27 | Disposition: A | Discharge status: Home / Self Care | Payer: Medicare Other | Source: Ambulatory Visit | Attending: Gastroenterology | Admitting: Gastroenterology

## 2014-03-27 DIAGNOSIS — K449 Diaphragmatic hernia without obstruction or gangrene: Secondary | ICD-10-CM | POA: Diagnosis not present

## 2014-03-27 DIAGNOSIS — R1310 Dysphagia, unspecified: Secondary | ICD-10-CM

## 2014-03-27 DIAGNOSIS — K219 Gastro-esophageal reflux disease without esophagitis: Secondary | ICD-10-CM | POA: Diagnosis not present

## 2014-04-18 DIAGNOSIS — Z1211 Encounter for screening for malignant neoplasm of colon: Secondary | ICD-10-CM | POA: Diagnosis not present

## 2014-04-18 DIAGNOSIS — K648 Other hemorrhoids: Secondary | ICD-10-CM | POA: Diagnosis not present

## 2014-04-18 DIAGNOSIS — Z800 Family history of malignant neoplasm of digestive organs: Secondary | ICD-10-CM | POA: Diagnosis not present

## 2014-04-30 ENCOUNTER — Ambulatory Visit: Payer: Medicare Other | Admitting: Podiatry

## 2014-05-01 DIAGNOSIS — R197 Diarrhea, unspecified: Secondary | ICD-10-CM | POA: Diagnosis not present

## 2014-05-01 DIAGNOSIS — N8110 Cystocele, unspecified: Secondary | ICD-10-CM | POA: Diagnosis not present

## 2014-05-01 DIAGNOSIS — R102 Pelvic and perineal pain: Secondary | ICD-10-CM | POA: Diagnosis not present

## 2014-05-01 DIAGNOSIS — R1900 Intra-abdominal and pelvic swelling, mass and lump, unspecified site: Secondary | ICD-10-CM | POA: Diagnosis not present

## 2014-05-04 DIAGNOSIS — E1142 Type 2 diabetes mellitus with diabetic polyneuropathy: Secondary | ICD-10-CM | POA: Diagnosis not present

## 2014-05-04 DIAGNOSIS — J309 Allergic rhinitis, unspecified: Secondary | ICD-10-CM | POA: Diagnosis not present

## 2014-05-04 DIAGNOSIS — H8110 Benign paroxysmal vertigo, unspecified ear: Secondary | ICD-10-CM | POA: Diagnosis not present

## 2014-05-04 DIAGNOSIS — E119 Type 2 diabetes mellitus without complications: Secondary | ICD-10-CM | POA: Diagnosis not present

## 2014-05-04 DIAGNOSIS — J4520 Mild intermittent asthma, uncomplicated: Secondary | ICD-10-CM | POA: Diagnosis not present

## 2014-05-04 DIAGNOSIS — Z23 Encounter for immunization: Secondary | ICD-10-CM | POA: Diagnosis not present

## 2014-05-04 DIAGNOSIS — E785 Hyperlipidemia, unspecified: Secondary | ICD-10-CM | POA: Diagnosis not present

## 2014-05-04 DIAGNOSIS — E559 Vitamin D deficiency, unspecified: Secondary | ICD-10-CM | POA: Diagnosis not present

## 2014-05-04 DIAGNOSIS — K219 Gastro-esophageal reflux disease without esophagitis: Secondary | ICD-10-CM | POA: Diagnosis not present

## 2014-05-04 DIAGNOSIS — Z13820 Encounter for screening for osteoporosis: Secondary | ICD-10-CM | POA: Diagnosis not present

## 2014-05-04 DIAGNOSIS — M1990 Unspecified osteoarthritis, unspecified site: Secondary | ICD-10-CM | POA: Diagnosis not present

## 2014-05-04 DIAGNOSIS — R1030 Lower abdominal pain, unspecified: Secondary | ICD-10-CM | POA: Diagnosis not present

## 2014-05-07 ENCOUNTER — Ambulatory Visit (INDEPENDENT_AMBULATORY_CARE_PROVIDER_SITE_OTHER): Payer: Medicare Other | Admitting: Podiatry

## 2014-05-07 ENCOUNTER — Other Ambulatory Visit: Payer: Self-pay | Admitting: Family Medicine

## 2014-05-07 DIAGNOSIS — R1030 Lower abdominal pain, unspecified: Secondary | ICD-10-CM

## 2014-05-07 DIAGNOSIS — B351 Tinea unguium: Secondary | ICD-10-CM

## 2014-05-07 DIAGNOSIS — M79676 Pain in unspecified toe(s): Secondary | ICD-10-CM

## 2014-05-08 NOTE — Progress Notes (Signed)
Patient ID: Penny Hernandez, female   DOB: Nov 03, 1940, 74 y.o.   MRN: [id number]  Subjective: This patient presents again complaining of painful toenails and walking wearing shoes  Objective: Orientated 3 The toenails are elongated, incurvated, discolored, hypertrophic and tender to palpation 6-10  Assessment: Symptomatic onychomycoses 6-10  Debridement toenails 10 without a bleeding  Reappoint 3 months

## 2014-05-10 ENCOUNTER — Ambulatory Visit
Admission: RE | Admit: 2014-05-10 | Discharge: 2014-05-10 | Disposition: A | Discharge status: Home / Self Care | Payer: Medicare Other | Source: Ambulatory Visit | Attending: Family Medicine | Admitting: Family Medicine

## 2014-05-10 DIAGNOSIS — R1903 Right lower quadrant abdominal swelling, mass and lump: Secondary | ICD-10-CM | POA: Diagnosis not present

## 2014-05-10 DIAGNOSIS — R1030 Lower abdominal pain, unspecified: Secondary | ICD-10-CM

## 2014-05-10 DIAGNOSIS — K760 Fatty (change of) liver, not elsewhere classified: Secondary | ICD-10-CM | POA: Diagnosis not present

## 2014-05-10 MED ORDER — IOHEXOL 300 MG/ML  SOLN
125.0000 mL | Freq: Once | INTRAMUSCULAR | Status: AC | PRN
  Administered 2014-05-10: 125 mL via INTRAVENOUS

## 2014-05-16 DIAGNOSIS — R102 Pelvic and perineal pain: Secondary | ICD-10-CM | POA: Diagnosis not present

## 2014-05-31 DIAGNOSIS — N8110 Cystocele, unspecified: Secondary | ICD-10-CM | POA: Diagnosis not present

## 2014-05-31 DIAGNOSIS — N3946 Mixed incontinence: Secondary | ICD-10-CM | POA: Diagnosis not present

## 2014-05-31 DIAGNOSIS — N819 Female genital prolapse, unspecified: Secondary | ICD-10-CM | POA: Diagnosis not present

## 2014-06-06 DIAGNOSIS — L810 Postinflammatory hyperpigmentation: Secondary | ICD-10-CM | POA: Diagnosis not present

## 2014-06-07 DIAGNOSIS — N951 Menopausal and female climacteric states: Secondary | ICD-10-CM | POA: Diagnosis not present

## 2014-06-07 DIAGNOSIS — Z13820 Encounter for screening for osteoporosis: Secondary | ICD-10-CM | POA: Diagnosis not present

## 2014-08-13 ENCOUNTER — Encounter: Payer: Self-pay | Admitting: Podiatry

## 2014-08-13 ENCOUNTER — Ambulatory Visit (INDEPENDENT_AMBULATORY_CARE_PROVIDER_SITE_OTHER): Payer: Medicare Other | Admitting: Podiatry

## 2014-08-13 DIAGNOSIS — B351 Tinea unguium: Secondary | ICD-10-CM | POA: Diagnosis not present

## 2014-08-13 DIAGNOSIS — M79676 Pain in unspecified toe(s): Secondary | ICD-10-CM

## 2014-08-13 NOTE — Patient Instructions (Signed)
Diabetes and Foot Care Diabetes may cause you to have problems because of poor blood supply (circulation) to your feet and legs. This may cause the skin on your feet to become thinner, break easier, and heal more slowly. Your skin may become dry, and the skin may peel and crack. You may also have nerve damage in your legs and feet causing decreased feeling in them. You may not notice minor injuries to your feet that could lead to infections or more serious problems. Taking care of your feet is one of the most important things you can do for yourself.  HOME CARE INSTRUCTIONS  Wear shoes at all times, even in the house. Do not go barefoot. Bare feet are easily injured.  Check your feet daily for blisters, cuts, and redness. If you cannot see the bottom of your feet, use a mirror or ask someone for help.  Wash your feet with warm water (do not use hot water) and mild soap. Then pat your feet and the areas between your toes until they are completely dry. Do not soak your feet as this can dry your skin.  Apply a moisturizing lotion or petroleum jelly (that does not contain alcohol and is unscented) to the skin on your feet and to dry, brittle toenails. Do not apply lotion between your toes.  Trim your toenails straight across. Do not dig under them or around the cuticle. File the edges of your nails with an emery board or nail file.  Do not cut corns or calluses or try to remove them with medicine.  Wear clean socks or stockings every day. Make sure they are not too tight. Do not wear knee-high stockings since they may decrease blood flow to your legs.  Wear shoes that fit properly and have enough cushioning. To break in new shoes, wear them for just a few hours a day. This prevents you from injuring your feet. Always look in your shoes before you put them on to be sure there are no objects inside.  Do not cross your legs. This may decrease the blood flow to your feet.  If you find a minor scrape,  cut, or break in the skin on your feet, keep it and the skin around it clean and dry. These areas may be cleansed with mild soap and water. Do not cleanse the area with peroxide, alcohol, or iodine.  When you remove an adhesive bandage, be sure not to damage the skin around it.  If you have a wound, look at it several times a day to make sure it is healing.  Do not use heating pads or hot water bottles. They may burn your skin. If you have lost feeling in your feet or legs, you may not know it is happening until it is too late.  Make sure your health care provider performs a complete foot exam at least annually or more often if you have foot problems. Report any cuts, sores, or bruises to your health care provider immediately. SEEK MEDICAL CARE IF:   You have an injury that is not healing.  You have cuts or breaks in the skin.  You have an ingrown nail.  You notice redness on your legs or feet.  You feel burning or tingling in your legs or feet.  You have pain or cramps in your legs and feet.  Your legs or feet are numb.  Your feet always feel cold. SEEK IMMEDIATE MEDICAL CARE IF:   There is increasing redness,   swelling, or pain in or around a wound.  There is a red line that goes up your leg.  Pus is coming from a wound.  You develop a fever or as directed by your health care provider.  You notice a bad smell coming from an ulcer or wound. Document Released: 03/20/2000 Document Revised: 11/23/2012 Document Reviewed: 08/30/2012 ExitCare Patient Information 2015 [employer]. This information is not intended to replace advice given to you by your health care provider. Make sure you discuss any questions you have with your health care provider.  

## 2014-08-14 NOTE — Progress Notes (Signed)
Patient ID: Penny Hernandez, female   DOB: [birth year], 74 y.o.   MRN: [id number]  Subjective: This patient presents again complaining of painful toenails and requesting nail debridement  Objective: The toenails are incurvated, elongated, hypertrophic, discolored and tender to direct palpation 6-10  Assessment: Symptomatic onychomycoses 6-10  Plan: Debridement of toenails 10 without any bleeding  Reappoint 3 months

## 2014-08-24 DIAGNOSIS — H903 Sensorineural hearing loss, bilateral: Secondary | ICD-10-CM | POA: Diagnosis not present

## 2014-09-13 DIAGNOSIS — H903 Sensorineural hearing loss, bilateral: Secondary | ICD-10-CM | POA: Diagnosis not present

## 2014-11-05 ENCOUNTER — Ambulatory Visit: Payer: Medicare Other | Admitting: Podiatry

## 2014-11-07 ENCOUNTER — Ambulatory Visit: Payer: Medicare Other | Admitting: Podiatry

## 2014-11-07 DIAGNOSIS — L089 Local infection of the skin and subcutaneous tissue, unspecified: Secondary | ICD-10-CM | POA: Diagnosis not present

## 2014-11-07 DIAGNOSIS — L818 Other specified disorders of pigmentation: Secondary | ICD-10-CM | POA: Diagnosis not present

## 2014-11-09 DIAGNOSIS — L818 Other specified disorders of pigmentation: Secondary | ICD-10-CM | POA: Insufficient documentation

## 2014-11-09 DIAGNOSIS — L089 Local infection of the skin and subcutaneous tissue, unspecified: Secondary | ICD-10-CM | POA: Insufficient documentation

## 2014-11-28 ENCOUNTER — Encounter: Payer: Self-pay | Admitting: Podiatry

## 2014-11-28 ENCOUNTER — Ambulatory Visit (INDEPENDENT_AMBULATORY_CARE_PROVIDER_SITE_OTHER): Payer: Medicare Other | Admitting: Podiatry

## 2014-11-28 DIAGNOSIS — M79676 Pain in unspecified toe(s): Secondary | ICD-10-CM

## 2014-11-28 DIAGNOSIS — B351 Tinea unguium: Secondary | ICD-10-CM | POA: Diagnosis not present

## 2014-11-29 NOTE — Patient Instructions (Signed)
Diabetes and Foot Care Diabetes may cause you to have problems because of poor blood supply (circulation) to your feet and legs. This may cause the skin on your feet to become thinner, break easier, and heal more slowly. Your skin may become dry, and the skin may peel and crack. You may also have nerve damage in your legs and feet causing decreased feeling in them. You may not notice minor injuries to your feet that could lead to infections or more serious problems. Taking care of your feet is one of the most important things you can do for yourself.  HOME CARE INSTRUCTIONS  Wear shoes at all times, even in the house. Do not go barefoot. Bare feet are easily injured.  Check your feet daily for blisters, cuts, and redness. If you cannot see the bottom of your feet, use a mirror or ask someone for help.  Wash your feet with warm water (do not use hot water) and mild soap. Then pat your feet and the areas between your toes until they are completely dry. Do not soak your feet as this can dry your skin.  Apply a moisturizing lotion or petroleum jelly (that does not contain alcohol and is unscented) to the skin on your feet and to dry, brittle toenails. Do not apply lotion between your toes.  Trim your toenails straight across. Do not dig under them or around the cuticle. File the edges of your nails with an emery board or nail file.  Do not cut corns or calluses or try to remove them with medicine.  Wear clean socks or stockings every day. Make sure they are not too tight. Do not wear knee-high stockings since they may decrease blood flow to your legs.  Wear shoes that fit properly and have enough cushioning. To break in new shoes, wear them for just a few hours a day. This prevents you from injuring your feet. Always look in your shoes before you put them on to be sure there are no objects inside.  Do not cross your legs. This may decrease the blood flow to your feet.  If you find a minor scrape,  cut, or break in the skin on your feet, keep it and the skin around it clean and dry. These areas may be cleansed with mild soap and water. Do not cleanse the area with peroxide, alcohol, or iodine.  When you remove an adhesive bandage, be sure not to damage the skin around it.  If you have a wound, look at it several times a day to make sure it is healing.  Do not use heating pads or hot water bottles. They may burn your skin. If you have lost feeling in your feet or legs, you may not know it is happening until it is too late.  Make sure your health care provider performs a complete foot exam at least annually or more often if you have foot problems. Report any cuts, sores, or bruises to your health care provider immediately. SEEK MEDICAL CARE IF:   You have an injury that is not healing.  You have cuts or breaks in the skin.  You have an ingrown nail.  You notice redness on your legs or feet.  You feel burning or tingling in your legs or feet.  You have pain or cramps in your legs and feet.  Your legs or feet are numb.  Your feet always feel cold. SEEK IMMEDIATE MEDICAL CARE IF:   There is increasing redness,   swelling, or pain in or around a wound.  There is a red line that goes up your leg.  Pus is coming from a wound.  You develop a fever or as directed by your health care provider.  You notice a bad smell coming from an ulcer or wound. Document Released: 03/20/2000 Document Revised: 11/23/2012 Document Reviewed: 08/30/2012 ExitCare Patient Information 2015 [employer]. This information is not intended to replace advice given to you by your health care provider. Make sure you discuss any questions you have with your health care provider.  

## 2014-11-29 NOTE — Progress Notes (Signed)
Patient ID: Penny Hernandez, female   DOB: 04-18-1940, 74 y.o.   MRN: [id number]  Subjective: This patient presents today for his scheduled visit complaining of painful toenails and requests toenail debridement  Objective: The toenails are elongated, incurvated, hypertrophic, discolored and tender direct palpation 6-10  Assessment: Symptomatic onychomycoses 6-10 Diabetic  Plan: Debridement toenails mechanically and electrically 10 without bleeding  Reappoint 3 months

## 2014-12-04 DIAGNOSIS — E785 Hyperlipidemia, unspecified: Secondary | ICD-10-CM | POA: Diagnosis not present

## 2014-12-04 DIAGNOSIS — E559 Vitamin D deficiency, unspecified: Secondary | ICD-10-CM | POA: Diagnosis not present

## 2014-12-04 DIAGNOSIS — E1142 Type 2 diabetes mellitus with diabetic polyneuropathy: Secondary | ICD-10-CM | POA: Diagnosis not present

## 2014-12-04 DIAGNOSIS — H8110 Benign paroxysmal vertigo, unspecified ear: Secondary | ICD-10-CM | POA: Diagnosis not present

## 2014-12-04 DIAGNOSIS — K219 Gastro-esophageal reflux disease without esophagitis: Secondary | ICD-10-CM | POA: Diagnosis not present

## 2014-12-04 DIAGNOSIS — L309 Dermatitis, unspecified: Secondary | ICD-10-CM | POA: Diagnosis not present

## 2015-02-20 DIAGNOSIS — R1030 Lower abdominal pain, unspecified: Secondary | ICD-10-CM | POA: Diagnosis not present

## 2015-02-20 DIAGNOSIS — L989 Disorder of the skin and subcutaneous tissue, unspecified: Secondary | ICD-10-CM | POA: Diagnosis not present

## 2015-02-26 DIAGNOSIS — R1030 Lower abdominal pain, unspecified: Secondary | ICD-10-CM | POA: Diagnosis not present

## 2015-02-26 DIAGNOSIS — R102 Pelvic and perineal pain: Secondary | ICD-10-CM | POA: Diagnosis not present

## 2015-03-06 ENCOUNTER — Ambulatory Visit (INDEPENDENT_AMBULATORY_CARE_PROVIDER_SITE_OTHER): Payer: Medicare Other | Admitting: Podiatry

## 2015-03-06 ENCOUNTER — Encounter: Payer: Self-pay | Admitting: Podiatry

## 2015-03-06 DIAGNOSIS — M79676 Pain in unspecified toe(s): Secondary | ICD-10-CM

## 2015-03-06 DIAGNOSIS — B351 Tinea unguium: Secondary | ICD-10-CM | POA: Diagnosis not present

## 2015-03-06 NOTE — Progress Notes (Signed)
Patient ID: Penny Hernandez, female   DOB: 1940-09-23, 74 y.o.   MRN: [id number]  Subjective: Patient presents again for scheduled visit complaining of painful toenails when walking wearing shoes and requests nail debridement  Objective: Orientated 3 No open skin lesions bilaterally The toenails are hypertrophic, discolored, deformed, incurvated and tender to direct palpation 6-10  Assessment: Symptomatic onychomycoses 6-10 Diabetic  Plan: Debrided toenails 10 mechanically and electronically without any bleeding  Reappoint 3 months

## 2015-03-06 NOTE — Patient Instructions (Signed)
Diabetes and Foot Care Diabetes may cause you to have problems because of poor blood supply (circulation) to your feet and legs. This may cause the skin on your feet to become thinner, break easier, and heal more slowly. Your skin may become dry, and the skin may peel and crack. You may also have nerve damage in your legs and feet causing decreased feeling in them. You may not notice minor injuries to your feet that could lead to infections or more serious problems. Taking care of your feet is one of the most important things you can do for yourself.  HOME CARE INSTRUCTIONS  Wear shoes at all times, even in the house. Do not go barefoot. Bare feet are easily injured.  Check your feet daily for blisters, cuts, and redness. If you cannot see the bottom of your feet, use a mirror or ask someone for help.  Wash your feet with warm water (do not use hot water) and mild soap. Then pat your feet and the areas between your toes until they are completely dry. Do not soak your feet as this can dry your skin.  Apply a moisturizing lotion or petroleum jelly (that does not contain alcohol and is unscented) to the skin on your feet and to dry, brittle toenails. Do not apply lotion between your toes.  Trim your toenails straight across. Do not dig under them or around the cuticle. File the edges of your nails with an emery board or nail file.  Do not cut corns or calluses or try to remove them with medicine.  Wear clean socks or stockings every day. Make sure they are not too tight. Do not wear knee-high stockings since they may decrease blood flow to your legs.  Wear shoes that fit properly and have enough cushioning. To break in new shoes, wear them for just a few hours a day. This prevents you from injuring your feet. Always look in your shoes before you put them on to be sure there are no objects inside.  Do not cross your legs. This may decrease the blood flow to your feet.  If you find a minor scrape,  cut, or break in the skin on your feet, keep it and the skin around it clean and dry. These areas may be cleansed with mild soap and water. Do not cleanse the area with peroxide, alcohol, or iodine.  When you remove an adhesive bandage, be sure not to damage the skin around it.  If you have a wound, look at it several times a day to make sure it is healing.  Do not use heating pads or hot water bottles. They may burn your skin. If you have lost feeling in your feet or legs, you may not know it is happening until it is too late.  Make sure your health care provider performs a complete foot exam at least annually or more often if you have foot problems. Report any cuts, sores, or bruises to your health care provider immediately. SEEK MEDICAL CARE IF:   You have an injury that is not healing.  You have cuts or breaks in the skin.  You have an ingrown nail.  You notice redness on your legs or feet.  You feel burning or tingling in your legs or feet.  You have pain or cramps in your legs and feet.  Your legs or feet are numb.  Your feet always feel cold. SEEK IMMEDIATE MEDICAL CARE IF:   There is increasing redness,   swelling, or pain in or around a wound.  There is a red line that goes up your leg.  Pus is coming from a wound.  You develop a fever or as directed by your health care provider.  You notice a bad smell coming from an ulcer or wound.   This information is not intended to replace advice given to you by your health care provider. Make sure you discuss any questions you have with your health care provider.   Document Released: 03/20/2000 Document Revised: 11/23/2012 Document Reviewed: 08/30/2012 Elsevier Interactive Patient Education 2016 [employer] Inc.  

## 2015-03-11 DIAGNOSIS — R1900 Intra-abdominal and pelvic swelling, mass and lump, unspecified site: Secondary | ICD-10-CM | POA: Diagnosis not present

## 2015-03-12 DIAGNOSIS — D045 Carcinoma in situ of skin of trunk: Secondary | ICD-10-CM | POA: Diagnosis not present

## 2015-03-12 DIAGNOSIS — D048 Carcinoma in situ of skin of other sites: Secondary | ICD-10-CM | POA: Diagnosis not present

## 2015-04-04 DIAGNOSIS — Z1231 Encounter for screening mammogram for malignant neoplasm of breast: Secondary | ICD-10-CM | POA: Diagnosis not present

## 2015-04-09 DIAGNOSIS — R1900 Intra-abdominal and pelvic swelling, mass and lump, unspecified site: Secondary | ICD-10-CM | POA: Diagnosis not present

## 2015-04-09 DIAGNOSIS — R879 Unspecified abnormal finding in specimens from female genital organs: Secondary | ICD-10-CM | POA: Diagnosis not present

## 2015-04-09 DIAGNOSIS — K649 Unspecified hemorrhoids: Secondary | ICD-10-CM | POA: Diagnosis not present

## 2015-04-26 DIAGNOSIS — H26491 Other secondary cataract, right eye: Secondary | ICD-10-CM | POA: Diagnosis not present

## 2015-05-02 DIAGNOSIS — R1030 Lower abdominal pain, unspecified: Secondary | ICD-10-CM | POA: Diagnosis not present

## 2015-05-02 DIAGNOSIS — D049 Carcinoma in situ of skin, unspecified: Secondary | ICD-10-CM | POA: Diagnosis not present

## 2015-05-02 DIAGNOSIS — K649 Unspecified hemorrhoids: Secondary | ICD-10-CM | POA: Diagnosis not present

## 2015-05-06 DIAGNOSIS — H26491 Other secondary cataract, right eye: Secondary | ICD-10-CM | POA: Diagnosis not present

## 2015-06-06 DIAGNOSIS — E559 Vitamin D deficiency, unspecified: Secondary | ICD-10-CM | POA: Diagnosis not present

## 2015-06-06 DIAGNOSIS — Z7984 Long term (current) use of oral hypoglycemic drugs: Secondary | ICD-10-CM | POA: Diagnosis not present

## 2015-06-06 DIAGNOSIS — H8110 Benign paroxysmal vertigo, unspecified ear: Secondary | ICD-10-CM | POA: Diagnosis not present

## 2015-06-06 DIAGNOSIS — E1142 Type 2 diabetes mellitus with diabetic polyneuropathy: Secondary | ICD-10-CM | POA: Diagnosis not present

## 2015-06-06 DIAGNOSIS — J449 Chronic obstructive pulmonary disease, unspecified: Secondary | ICD-10-CM | POA: Diagnosis not present

## 2015-06-06 DIAGNOSIS — K219 Gastro-esophageal reflux disease without esophagitis: Secondary | ICD-10-CM | POA: Diagnosis not present

## 2015-06-06 DIAGNOSIS — E785 Hyperlipidemia, unspecified: Secondary | ICD-10-CM | POA: Diagnosis not present

## 2015-06-10 DIAGNOSIS — L905 Scar conditions and fibrosis of skin: Secondary | ICD-10-CM | POA: Diagnosis not present

## 2015-06-10 DIAGNOSIS — Z85828 Personal history of other malignant neoplasm of skin: Secondary | ICD-10-CM | POA: Diagnosis not present

## 2015-06-12 ENCOUNTER — Encounter: Payer: Self-pay | Admitting: Podiatry

## 2015-06-12 ENCOUNTER — Ambulatory Visit (INDEPENDENT_AMBULATORY_CARE_PROVIDER_SITE_OTHER): Payer: Medicare Other | Admitting: Podiatry

## 2015-06-12 DIAGNOSIS — B351 Tinea unguium: Secondary | ICD-10-CM

## 2015-06-12 DIAGNOSIS — M79676 Pain in unspecified toe(s): Secondary | ICD-10-CM | POA: Diagnosis not present

## 2015-06-12 NOTE — Patient Instructions (Signed)
Diabetes and Foot Care Diabetes may cause you to have problems because of poor blood supply (circulation) to your feet and legs. This may cause the skin on your feet to become thinner, break easier, and heal more slowly. Your skin may become dry, and the skin may peel and crack. You may also have nerve damage in your legs and feet causing decreased feeling in them. You may not notice minor injuries to your feet that could lead to infections or more serious problems. Taking care of your feet is one of the most important things you can do for yourself.  HOME CARE INSTRUCTIONS  Wear shoes at all times, even in the house. Do not go barefoot. Bare feet are easily injured.  Check your feet daily for blisters, cuts, and redness. If you cannot see the bottom of your feet, use a mirror or ask someone for help.  Wash your feet with warm water (do not use hot water) and mild soap. Then pat your feet and the areas between your toes until they are completely dry. Do not soak your feet as this can dry your skin.  Apply a moisturizing lotion or petroleum jelly (that does not contain alcohol and is unscented) to the skin on your feet and to dry, brittle toenails. Do not apply lotion between your toes.  Trim your toenails straight across. Do not dig under them or around the cuticle. File the edges of your nails with an emery board or nail file.  Do not cut corns or calluses or try to remove them with medicine.  Wear clean socks or stockings every day. Make sure they are not too tight. Do not wear knee-high stockings since they may decrease blood flow to your legs.  Wear shoes that fit properly and have enough cushioning. To break in new shoes, wear them for just a few hours a day. This prevents you from injuring your feet. Always look in your shoes before you put them on to be sure there are no objects inside.  Do not cross your legs. This may decrease the blood flow to your feet.  If you find a minor scrape,  cut, or break in the skin on your feet, keep it and the skin around it clean and dry. These areas may be cleansed with mild soap and water. Do not cleanse the area with peroxide, alcohol, or iodine.  When you remove an adhesive bandage, be sure not to damage the skin around it.  If you have a wound, look at it several times a day to make sure it is healing.  Do not use heating pads or hot water bottles. They may burn your skin. If you have lost feeling in your feet or legs, you may not know it is happening until it is too late.  Make sure your health care provider performs a complete foot exam at least annually or more often if you have foot problems. Report any cuts, sores, or bruises to your health care provider immediately. SEEK MEDICAL CARE IF:   You have an injury that is not healing.  You have cuts or breaks in the skin.  You have an ingrown nail.  You notice redness on your legs or feet.  You feel burning or tingling in your legs or feet.  You have pain or cramps in your legs and feet.  Your legs or feet are numb.  Your feet always feel cold. SEEK IMMEDIATE MEDICAL CARE IF:   There is increasing redness,   swelling, or pain in or around a wound.  There is a red line that goes up your leg.  Pus is coming from a wound.  You develop a fever or as directed by your health care provider.  You notice a bad smell coming from an ulcer or wound.   This information is not intended to replace advice given to you by your health care provider. Make sure you discuss any questions you have with your health care provider.   Document Released: 03/20/2000 Document Revised: 11/23/2012 Document Reviewed: 08/30/2012 Elsevier Interactive Patient Education 2016 [employer] Inc.  

## 2015-06-12 NOTE — Progress Notes (Signed)
Patient ID: Penny Hernandez, female   DOB: [birth year], 75 y.o.   MRN: [id number]  Subjective: Patient presents again for scheduled visit complaining of painful toenails when walking wearing shoes and requests nail debridement  Objective: Orientated 3 No open skin lesions bilaterally The toenails are hypertrophic, discolored, deformed, incurvated and tender to direct palpation 6-10  Assessment: Symptomatic onychomycoses 6-10 Diabetic  Plan: Debrided toenails 10 mechanically and electronically without any bleeding  Reappoint 3 months

## 2015-08-02 DIAGNOSIS — Z85828 Personal history of other malignant neoplasm of skin: Secondary | ICD-10-CM | POA: Diagnosis not present

## 2015-08-02 DIAGNOSIS — K649 Unspecified hemorrhoids: Secondary | ICD-10-CM | POA: Diagnosis not present

## 2015-09-17 ENCOUNTER — Encounter: Payer: Self-pay | Admitting: Podiatry

## 2015-09-17 ENCOUNTER — Ambulatory Visit (INDEPENDENT_AMBULATORY_CARE_PROVIDER_SITE_OTHER): Payer: Medicare Other | Admitting: Podiatry

## 2015-09-17 DIAGNOSIS — B351 Tinea unguium: Secondary | ICD-10-CM | POA: Diagnosis not present

## 2015-09-17 DIAGNOSIS — M79676 Pain in unspecified toe(s): Secondary | ICD-10-CM

## 2015-09-17 NOTE — Patient Instructions (Signed)
Diabetes and Foot Care Diabetes may cause you to have problems because of poor blood supply (circulation) to your feet and legs. This may cause the skin on your feet to become thinner, break easier, and heal more slowly. Your skin may become dry, and the skin may peel and crack. You may also have nerve damage in your legs and feet causing decreased feeling in them. You may not notice minor injuries to your feet that could lead to infections or more serious problems. Taking care of your feet is one of the most important things you can do for yourself.  HOME CARE INSTRUCTIONS  Wear shoes at all times, even in the house. Do not go barefoot. Bare feet are easily injured.  Check your feet daily for blisters, cuts, and redness. If you cannot see the bottom of your feet, use a mirror or ask someone for help.  Wash your feet with warm water (do not use hot water) and mild soap. Then pat your feet and the areas between your toes until they are completely dry. Do not soak your feet as this can dry your skin.  Apply a moisturizing lotion or petroleum jelly (that does not contain alcohol and is unscented) to the skin on your feet and to dry, brittle toenails. Do not apply lotion between your toes.  Trim your toenails straight across. Do not dig under them or around the cuticle. File the edges of your nails with an emery board or nail file.  Do not cut corns or calluses or try to remove them with medicine.  Wear clean socks or stockings every day. Make sure they are not too tight. Do not wear knee-high stockings since they may decrease blood flow to your legs.  Wear shoes that fit properly and have enough cushioning. To break in new shoes, wear them for just a few hours a day. This prevents you from injuring your feet. Always look in your shoes before you put them on to be sure there are no objects inside.  Do not cross your legs. This may decrease the blood flow to your feet.  If you find a minor scrape,  cut, or break in the skin on your feet, keep it and the skin around it clean and dry. These areas may be cleansed with mild soap and water. Do not cleanse the area with peroxide, alcohol, or iodine.  When you remove an adhesive bandage, be sure not to damage the skin around it.  If you have a wound, look at it several times a day to make sure it is healing.  Do not use heating pads or hot water bottles. They may burn your skin. If you have lost feeling in your feet or legs, you may not know it is happening until it is too late.  Make sure your health care provider performs a complete foot exam at least annually or more often if you have foot problems. Report any cuts, sores, or bruises to your health care provider immediately. SEEK MEDICAL CARE IF:   You have an injury that is not healing.  You have cuts or breaks in the skin.  You have an ingrown nail.  You notice redness on your legs or feet.  You feel burning or tingling in your legs or feet.  You have pain or cramps in your legs and feet.  Your legs or feet are numb.  Your feet always feel cold. SEEK IMMEDIATE MEDICAL CARE IF:   There is increasing redness,   swelling, or pain in or around a wound.  There is a red line that goes up your leg.  Pus is coming from a wound.  You develop a fever or as directed by your health care provider.  You notice a bad smell coming from an ulcer or wound.   This information is not intended to replace advice given to you by your health care provider. Make sure you discuss any questions you have with your health care provider.   Document Released: 03/20/2000 Document Revised: 11/23/2012 Document Reviewed: 08/30/2012 Elsevier Interactive Patient Education 2016 [employer] Inc.  

## 2015-09-17 NOTE — Progress Notes (Signed)
Patient ID: Penny Hernandez, female   DOB: Aug 12, 1940, 75 y.o.   MRN: [id number]  Subjective: Patient presents again for scheduled visit complaining of painful toenails when walking wearing shoes and requests nail debridement  Objective: Orientated 3 No open skin lesions bilaterally The toenails are hypertrophic, discolored, deformed, incurvated and tender to direct palpation 6-10  Assessment: Symptomatic onychomycoses 6-10 Diabetic  Plan: Debrided toenails 10 mechanically and electronically without any bleeding

## 2015-09-18 DIAGNOSIS — Z85828 Personal history of other malignant neoplasm of skin: Secondary | ICD-10-CM | POA: Diagnosis not present

## 2015-09-18 DIAGNOSIS — A630 Anogenital (venereal) warts: Secondary | ICD-10-CM | POA: Diagnosis not present

## 2015-12-20 DIAGNOSIS — J449 Chronic obstructive pulmonary disease, unspecified: Secondary | ICD-10-CM | POA: Diagnosis not present

## 2015-12-20 DIAGNOSIS — E1142 Type 2 diabetes mellitus with diabetic polyneuropathy: Secondary | ICD-10-CM | POA: Diagnosis not present

## 2015-12-20 DIAGNOSIS — E559 Vitamin D deficiency, unspecified: Secondary | ICD-10-CM | POA: Diagnosis not present

## 2015-12-20 DIAGNOSIS — K219 Gastro-esophageal reflux disease without esophagitis: Secondary | ICD-10-CM | POA: Diagnosis not present

## 2015-12-20 DIAGNOSIS — E785 Hyperlipidemia, unspecified: Secondary | ICD-10-CM | POA: Diagnosis not present

## 2015-12-24 ENCOUNTER — Ambulatory Visit (INDEPENDENT_AMBULATORY_CARE_PROVIDER_SITE_OTHER): Payer: Medicare Other | Admitting: Podiatry

## 2015-12-24 ENCOUNTER — Encounter: Payer: Self-pay | Admitting: Podiatry

## 2015-12-24 VITALS — BP 140/81 | HR 81 | Resp 14

## 2015-12-24 DIAGNOSIS — M79676 Pain in unspecified toe(s): Secondary | ICD-10-CM | POA: Diagnosis not present

## 2015-12-24 DIAGNOSIS — B351 Tinea unguium: Secondary | ICD-10-CM | POA: Diagnosis not present

## 2015-12-24 NOTE — Progress Notes (Signed)
Patient ID: Penny Hernandez, female   DOB: Jun 21, 1940, 75 y.o.   MRN: [id number]    Patient presents again for scheduled visit complaining of painful toenails when walking wearing shoes and requests nail debridement  Objective: Orientated 3 DP and PT pulses 2/4 bilaterally Capillary reflex immediate bilaterally Sensation to 10 g monofilament wire intact 5/5 bilaterally Vibratory sensation reactive bilaterally Reflex equal and reactive bilaterally No open skin lesions bilaterally The toenails are hypertrophic, discolored, deformed, incurvated and tender to direct palpation 6-10  Assessment: Symptomatic onychomycoses 6-10 Diabetic without foot complications Protective sensation intact  Plan: Debrided toenails 10 mechanically and electronically without any bleeding  Reappoint 3 months

## 2015-12-24 NOTE — Patient Instructions (Signed)
Diabetes and Foot Care Diabetes may cause you to have problems because of poor blood supply (circulation) to your feet and legs. This may cause the skin on your feet to become thinner, break easier, and heal more slowly. Your skin may become dry, and the skin may peel and crack. You may also have nerve damage in your legs and feet causing decreased feeling in them. You may not notice minor injuries to your feet that could lead to infections or more serious problems. Taking care of your feet is one of the most important things you can do for yourself.  HOME CARE INSTRUCTIONS  Wear shoes at all times, even in the house. Do not go barefoot. Bare feet are easily injured.  Check your feet daily for blisters, cuts, and redness. If you cannot see the bottom of your feet, use a mirror or ask someone for help.  Wash your feet with warm water (do not use hot water) and mild soap. Then pat your feet and the areas between your toes until they are completely dry. Do not soak your feet as this can dry your skin.  Apply a moisturizing lotion or petroleum jelly (that does not contain alcohol and is unscented) to the skin on your feet and to dry, brittle toenails. Do not apply lotion between your toes.  Trim your toenails straight across. Do not dig under them or around the cuticle. File the edges of your nails with an emery board or nail file.  Do not cut corns or calluses or try to remove them with medicine.  Wear clean socks or stockings every day. Make sure they are not too tight. Do not wear knee-high stockings since they may decrease blood flow to your legs.  Wear shoes that fit properly and have enough cushioning. To break in new shoes, wear them for just a few hours a day. This prevents you from injuring your feet. Always look in your shoes before you put them on to be sure there are no objects inside.  Do not cross your legs. This may decrease the blood flow to your feet.  If you find a minor scrape,  cut, or break in the skin on your feet, keep it and the skin around it clean and dry. These areas may be cleansed with mild soap and water. Do not cleanse the area with peroxide, alcohol, or iodine.  When you remove an adhesive bandage, be sure not to damage the skin around it.  If you have a wound, look at it several times a day to make sure it is healing.  Do not use heating pads or hot water bottles. They may burn your skin. If you have lost feeling in your feet or legs, you may not know it is happening until it is too late.  Make sure your health care provider performs a complete foot exam at least annually or more often if you have foot problems. Report any cuts, sores, or bruises to your health care provider immediately. SEEK MEDICAL CARE IF:   You have an injury that is not healing.  You have cuts or breaks in the skin.  You have an ingrown nail.  You notice redness on your legs or feet.  You feel burning or tingling in your legs or feet.  You have pain or cramps in your legs and feet.  Your legs or feet are numb.  Your feet always feel cold. SEEK IMMEDIATE MEDICAL CARE IF:   There is increasing redness,   swelling, or pain in or around a wound.  There is a red line that goes up your leg.  Pus is coming from a wound.  You develop a fever or as directed by your health care provider.  You notice a bad smell coming from an ulcer or wound.   This information is not intended to replace advice given to you by your health care provider. Make sure you discuss any questions you have with your health care provider.   Document Released: 03/20/2000 Document Revised: 11/23/2012 Document Reviewed: 08/30/2012 Elsevier Interactive Patient Education 2016 [employer] Inc.  

## 2016-01-15 ENCOUNTER — Encounter: Payer: Medicare Other | Attending: Family Medicine | Admitting: Dietician

## 2016-01-15 DIAGNOSIS — E118 Type 2 diabetes mellitus with unspecified complications: Secondary | ICD-10-CM

## 2016-01-15 DIAGNOSIS — E1142 Type 2 diabetes mellitus with diabetic polyneuropathy: Secondary | ICD-10-CM | POA: Insufficient documentation

## 2016-01-15 NOTE — Patient Instructions (Signed)
Goals:  Follow Diabetes Meal Plan as instructed  Try to eat at least 3 times per day  Middle "meal" can be a snack portion  Add lean protein foods to meals/snacks  Limit carbohydrate intake to 30-45 grams carbohydrate/meal  Limit carbohydrate intake to 0-15 grams carbohydrate/snack  Monitor glucose levels as instructed by your doctor  Plan to start walking about 15-20 minutes a couple times per week  Aim for a goal of working your way up to 150 minutes (30 minutes) of exercise per week  Try stevia/truvia instead of splenda

## 2016-01-15 NOTE — Progress Notes (Signed)
  Medical Nutrition Therapy:  Appt start time: 1050 end time:  1150.   Assessment:  Primary concerns today: Penny Hernandez is here today. Most recently Hgb A1c was 6.0% in September. Hgb A1c has usually run in the 6 range for the past 5 years.  Has been trying to control her diabetes on her own but not sure which foods to eat. Tries to avoid sweets and fried foods.   Tests blood sugar about 1-2 x day and averages 130-150 mg/dl. Taking Metformin.  Does not work and lives with her daughter. They share food shopping and meal preparation at home. Has been told she doesn't "eat like she should" by her kids. Doesn't have much of an appetite. Will miss and skip meals sometimes. Trying to establish a routine of eating.   Preferred Learning Style:   No preference indicated   Learning Readiness:   Ready  MEDICATIONS: metformin   DIETARY INTAKE:  Usual eating pattern includes 2 meals and 2 snacks per day.  Avoided foods include: spinach  24-hr recall:  B ( AM): oatmeal with cinnamon and splenda or bacon and egg sandwich  Snk ( AM): none or chips (salty)  L ( PM): none or nabs or strawberries/grapes/apples Snk ( PM): nabs or fruit D ( PM): baked/fried chicken or pork chop with green bean, cabbage, or peas with macaroni and cheese Snk ( PM): none or fruit Beverages: coffee with milk and splenda, water, cutting back on soda  Usual physical activity: none  Estimated energy needs: 1600 calories 180 g carbohydrates 120 g protein 44 g fat  Progress Towards Goal(s):  In progress.   Nutritional Diagnosis:  [place]-2.1 Inpaired nutrition utilization As related to glucose metabolism.  As evidenced by Hgb A1c of 6.0%.    Intervention:  Nutrition counseling provded. Goals:  Follow Diabetes Meal Plan as instructed  Try to eat at least 3 times per day  Middle "meal" can be a snack portion  Add lean protein foods to meals/snacks  Limit carbohydrate intake to 30-45 grams carbohydrate/meal  Limit  carbohydrate intake to 0-15 grams carbohydrate/snack  Monitor glucose levels as instructed by your doctor  Plan to start walking about 15-20 minutes a couple times per week  Aim for a goal of working your way up to 150 minutes (30 minutes) of exercise per week  Try stevia/truvia instead of splenda   Teaching Method Utilized:  Visual Auditory Hands on  Handouts given during visit include:  Living Well With Diabetes  Meal Card  15 g CHO Snacks  Barriers to learning/adherence to lifestyle change: none  Demonstrated degree of understanding via:  Teach Back   Monitoring/Evaluation:  Dietary intake, exercise, and body weight prn.

## 2016-02-10 DIAGNOSIS — D485 Neoplasm of uncertain behavior of skin: Secondary | ICD-10-CM | POA: Diagnosis not present

## 2016-02-10 DIAGNOSIS — D045 Carcinoma in situ of skin of trunk: Secondary | ICD-10-CM | POA: Diagnosis not present

## 2016-02-10 DIAGNOSIS — L821 Other seborrheic keratosis: Secondary | ICD-10-CM | POA: Diagnosis not present

## 2016-02-10 DIAGNOSIS — L918 Other hypertrophic disorders of the skin: Secondary | ICD-10-CM | POA: Diagnosis not present

## 2016-02-10 DIAGNOSIS — D048 Carcinoma in situ of skin of other sites: Secondary | ICD-10-CM | POA: Diagnosis not present

## 2016-02-24 DIAGNOSIS — D048 Carcinoma in situ of skin of other sites: Secondary | ICD-10-CM | POA: Diagnosis not present

## 2016-03-24 ENCOUNTER — Encounter: Payer: Self-pay | Admitting: Podiatry

## 2016-03-24 ENCOUNTER — Ambulatory Visit (INDEPENDENT_AMBULATORY_CARE_PROVIDER_SITE_OTHER): Payer: Medicare Other | Admitting: Podiatry

## 2016-03-24 DIAGNOSIS — B351 Tinea unguium: Secondary | ICD-10-CM | POA: Diagnosis not present

## 2016-03-24 DIAGNOSIS — M79676 Pain in unspecified toe(s): Secondary | ICD-10-CM

## 2016-03-24 NOTE — Patient Instructions (Signed)

## 2016-03-24 NOTE — Progress Notes (Signed)
Patient ID: Penny Hernandez, female   DOB: 1940-09-18, 75 y.o.   MRN: [id number]   Subjective: Patient presents again for scheduled visit complaining of painful toenails when walking wearing shoes and requests nail debridement  Objective: Orientated 3 DP and PT pulses 2/4 bilaterally Capillary reflex immediate bilaterally Sensation to 10 g monofilament wire intact 5/5 bilaterally Vibratory sensation reactive bilaterally Ankle Reflex equal and reactive bilaterally Manual motor testing dorsi flexion, plantar flexion, inversion, eversion 5/5 bilaterally No restriction or crepitus on range of motion of ankle, subtalar, midtarsal joints bilaterally No open skin lesions bilaterally The toenails are hypertrophic, discolored, deformed, incurvated and tender to direct palpation 6-10  Assessment: Symptomatic onychomycoses 6-10 Diabetic without foot complications Satisfactory neurovascular status  Plan: Debrided toenails 10 mechanically and electronically without any bleeding  Reappoint 3 months

## 2016-03-25 DIAGNOSIS — H43813 Vitreous degeneration, bilateral: Secondary | ICD-10-CM | POA: Diagnosis not present

## 2016-03-25 DIAGNOSIS — E119 Type 2 diabetes mellitus without complications: Secondary | ICD-10-CM | POA: Diagnosis not present

## 2016-03-25 DIAGNOSIS — H35033 Hypertensive retinopathy, bilateral: Secondary | ICD-10-CM | POA: Diagnosis not present

## 2016-03-25 DIAGNOSIS — Z961 Presence of intraocular lens: Secondary | ICD-10-CM | POA: Diagnosis not present

## 2016-04-14 DIAGNOSIS — Z1231 Encounter for screening mammogram for malignant neoplasm of breast: Secondary | ICD-10-CM | POA: Diagnosis not present

## 2016-05-13 DIAGNOSIS — Z85828 Personal history of other malignant neoplasm of skin: Secondary | ICD-10-CM | POA: Diagnosis not present

## 2016-05-13 DIAGNOSIS — A630 Anogenital (venereal) warts: Secondary | ICD-10-CM | POA: Diagnosis not present

## 2016-06-18 DIAGNOSIS — J449 Chronic obstructive pulmonary disease, unspecified: Secondary | ICD-10-CM | POA: Diagnosis not present

## 2016-06-18 DIAGNOSIS — E559 Vitamin D deficiency, unspecified: Secondary | ICD-10-CM | POA: Diagnosis not present

## 2016-06-18 DIAGNOSIS — Z7984 Long term (current) use of oral hypoglycemic drugs: Secondary | ICD-10-CM | POA: Diagnosis not present

## 2016-06-18 DIAGNOSIS — E119 Type 2 diabetes mellitus without complications: Secondary | ICD-10-CM | POA: Diagnosis not present

## 2016-06-18 DIAGNOSIS — K219 Gastro-esophageal reflux disease without esophagitis: Secondary | ICD-10-CM | POA: Diagnosis not present

## 2016-06-18 DIAGNOSIS — E1142 Type 2 diabetes mellitus with diabetic polyneuropathy: Secondary | ICD-10-CM | POA: Diagnosis not present

## 2016-06-18 DIAGNOSIS — E7800 Pure hypercholesterolemia, unspecified: Secondary | ICD-10-CM | POA: Diagnosis not present

## 2016-06-18 DIAGNOSIS — G4733 Obstructive sleep apnea (adult) (pediatric): Secondary | ICD-10-CM | POA: Diagnosis not present

## 2016-06-23 ENCOUNTER — Ambulatory Visit: Payer: Medicare Other | Admitting: Podiatry

## 2016-07-14 DIAGNOSIS — G4733 Obstructive sleep apnea (adult) (pediatric): Secondary | ICD-10-CM | POA: Diagnosis not present

## 2016-08-05 ENCOUNTER — Ambulatory Visit (INDEPENDENT_AMBULATORY_CARE_PROVIDER_SITE_OTHER): Payer: Medicare Other | Admitting: Podiatry

## 2016-08-05 NOTE — Progress Notes (Signed)
NO SHOW

## 2016-08-06 NOTE — Progress Notes (Signed)
Patient ID: Penny Hernandez, female   DOB: January 06, 1941, 76 y.o.   MRN: [id number]   No-show

## 2016-08-26 ENCOUNTER — Encounter: Payer: Self-pay | Admitting: Podiatry

## 2016-08-26 ENCOUNTER — Ambulatory Visit (INDEPENDENT_AMBULATORY_CARE_PROVIDER_SITE_OTHER): Payer: Medicare Other | Admitting: Podiatry

## 2016-08-26 DIAGNOSIS — L918 Other hypertrophic disorders of the skin: Secondary | ICD-10-CM | POA: Diagnosis not present

## 2016-08-26 DIAGNOSIS — B351 Tinea unguium: Secondary | ICD-10-CM | POA: Diagnosis not present

## 2016-08-26 DIAGNOSIS — A630 Anogenital (venereal) warts: Secondary | ICD-10-CM | POA: Diagnosis not present

## 2016-08-26 DIAGNOSIS — M79676 Pain in unspecified toe(s): Secondary | ICD-10-CM

## 2016-08-26 DIAGNOSIS — E119 Type 2 diabetes mellitus without complications: Secondary | ICD-10-CM

## 2016-08-26 DIAGNOSIS — Z85828 Personal history of other malignant neoplasm of skin: Secondary | ICD-10-CM | POA: Diagnosis not present

## 2016-08-26 DIAGNOSIS — L309 Dermatitis, unspecified: Secondary | ICD-10-CM | POA: Diagnosis not present

## 2016-08-26 NOTE — Patient Instructions (Signed)

## 2016-08-26 NOTE — Progress Notes (Signed)
Patient ID: Penny Hernandez, female   DOB: [birth year], 76 y.o.   MRN: [id number]   Subjective: Patient presents again for scheduled visit complaining of painful toenails when walking wearing shoes and requests nail debridement  Objective: Orientated 3 DP and PT pulses 2/4 bilaterally Capillary reflex immediate bilaterally Sensation to 10 g monofilament wire intact 5/5 bilaterally Vibratory sensation reactive bilaterally Ankle Reflex equal and reactive bilaterally Manual motor testing dorsi flexion, plantar flexion, inversion, eversion 5/5 bilaterally No restriction or crepitus on range of motion of ankle, subtalar, midtarsal joints bilaterally No open skin lesions bilaterally The toenails are hypertrophic, discolored, deformed, incurvated and tender to direct palpation 6-10 Absent hair growth bilaterally  Assessment: Symptomatic onychomycoses 6-10 Diabeticwithout foot complications Satisfactory neurovascular status  Plan: Debride toenails 10 mechanically and electronically without any bleeding  Reappoint 3 months

## 2016-10-09 DIAGNOSIS — A630 Anogenital (venereal) warts: Secondary | ICD-10-CM | POA: Diagnosis not present

## 2016-10-09 DIAGNOSIS — L02225 Furuncle of perineum: Secondary | ICD-10-CM | POA: Diagnosis not present

## 2016-11-25 ENCOUNTER — Encounter: Payer: Self-pay | Admitting: Podiatry

## 2016-11-25 ENCOUNTER — Ambulatory Visit (INDEPENDENT_AMBULATORY_CARE_PROVIDER_SITE_OTHER): Payer: Medicare Other | Admitting: Podiatry

## 2016-11-25 DIAGNOSIS — B351 Tinea unguium: Secondary | ICD-10-CM | POA: Diagnosis not present

## 2016-11-25 DIAGNOSIS — M79676 Pain in unspecified toe(s): Secondary | ICD-10-CM

## 2016-11-25 DIAGNOSIS — E119 Type 2 diabetes mellitus without complications: Secondary | ICD-10-CM

## 2016-11-25 NOTE — Patient Instructions (Signed)

## 2016-11-25 NOTE — Progress Notes (Signed)
Patient ID: Penny Hernandez, female   DOB: 07/19/1940, 76 y.o.   MRN: [id number]   Subjective: Patient presents again for scheduled visit complaining of painful toenails when walking wearing shoes and requests nail debridement  Objective: Orientated 3 DP and PT pulses 2/4 bilaterally Capillary reflex immediate bilaterally Sensation to 10 g monofilament wire intact 5/5 bilaterally Vibratory sensation reactive bilaterally Ankle Reflex equal and reactive bilaterally Manual motor testing dorsi flexion, plantar flexion, inversion, eversion 5/5 bilaterally No restriction or crepitus on range of motion of ankle, subtalar, midtarsal joints bilaterally No open skin lesions bilaterally The toenails are hypertrophic, discolored, deformed, incurvated and tender to direct palpation 6-10 Absent hair growth bilaterally  Assessment: Symptomatic onychomycoses 6-10 Diabeticwithout foot complications Satisfactory neurovascular status  Plan: Debride toenails 10 mechanically and electronically without any bleeding  Reappoint 3 months   

## 2016-12-22 DIAGNOSIS — N3000 Acute cystitis without hematuria: Secondary | ICD-10-CM | POA: Diagnosis not present

## 2016-12-22 DIAGNOSIS — Z1389 Encounter for screening for other disorder: Secondary | ICD-10-CM | POA: Diagnosis not present

## 2016-12-22 DIAGNOSIS — Z6835 Body mass index (BMI) 35.0-35.9, adult: Secondary | ICD-10-CM | POA: Diagnosis not present

## 2016-12-22 DIAGNOSIS — E7800 Pure hypercholesterolemia, unspecified: Secondary | ICD-10-CM | POA: Diagnosis not present

## 2016-12-22 DIAGNOSIS — E559 Vitamin D deficiency, unspecified: Secondary | ICD-10-CM | POA: Diagnosis not present

## 2016-12-22 DIAGNOSIS — K219 Gastro-esophageal reflux disease without esophagitis: Secondary | ICD-10-CM | POA: Diagnosis not present

## 2016-12-22 DIAGNOSIS — J449 Chronic obstructive pulmonary disease, unspecified: Secondary | ICD-10-CM | POA: Diagnosis not present

## 2016-12-22 DIAGNOSIS — Z7984 Long term (current) use of oral hypoglycemic drugs: Secondary | ICD-10-CM | POA: Diagnosis not present

## 2016-12-22 DIAGNOSIS — G4733 Obstructive sleep apnea (adult) (pediatric): Secondary | ICD-10-CM | POA: Diagnosis not present

## 2016-12-22 DIAGNOSIS — R300 Dysuria: Secondary | ICD-10-CM | POA: Diagnosis not present

## 2016-12-22 DIAGNOSIS — E1142 Type 2 diabetes mellitus with diabetic polyneuropathy: Secondary | ICD-10-CM | POA: Diagnosis not present

## 2017-01-05 ENCOUNTER — Other Ambulatory Visit: Payer: Self-pay | Admitting: Family Medicine

## 2017-01-05 DIAGNOSIS — I739 Peripheral vascular disease, unspecified: Secondary | ICD-10-CM

## 2017-01-11 DIAGNOSIS — L309 Dermatitis, unspecified: Secondary | ICD-10-CM | POA: Diagnosis not present

## 2017-01-11 DIAGNOSIS — A630 Anogenital (venereal) warts: Secondary | ICD-10-CM | POA: Diagnosis not present

## 2017-01-18 ENCOUNTER — Ambulatory Visit
Admission: RE | Admit: 2017-01-18 | Discharge: 2017-01-18 | Disposition: A | Discharge status: Home / Self Care | Payer: Medicare Other | Source: Ambulatory Visit | Attending: Family Medicine | Admitting: Family Medicine

## 2017-01-18 DIAGNOSIS — I739 Peripheral vascular disease, unspecified: Secondary | ICD-10-CM

## 2017-01-18 DIAGNOSIS — M79604 Pain in right leg: Secondary | ICD-10-CM | POA: Diagnosis not present

## 2017-02-22 ENCOUNTER — Ambulatory Visit: Payer: Medicare Other | Admitting: Podiatry

## 2017-03-01 ENCOUNTER — Encounter: Payer: Self-pay | Admitting: Podiatry

## 2017-03-01 ENCOUNTER — Ambulatory Visit (INDEPENDENT_AMBULATORY_CARE_PROVIDER_SITE_OTHER): Payer: Medicare Other | Admitting: Podiatry

## 2017-03-01 DIAGNOSIS — M79675 Pain in left toe(s): Secondary | ICD-10-CM

## 2017-03-01 DIAGNOSIS — M79674 Pain in right toe(s): Secondary | ICD-10-CM | POA: Diagnosis not present

## 2017-03-01 DIAGNOSIS — B351 Tinea unguium: Secondary | ICD-10-CM

## 2017-03-01 NOTE — Patient Instructions (Signed)

## 2017-03-01 NOTE — Progress Notes (Signed)
Patient ID: Penny Hernandez, female   DOB: 1940-11-27, 76 y.o.   MRN: [id number]   Subjective: Patient presents again for scheduled visit complaining of painful toenails when walking wearing shoes and requests nail debridement  Objective: Orientated 3 DP and PT pulses 2/4 bilaterally Capillary reflex immediate bilaterally Sensation to 10 g monofilament wire intact 5/5 bilaterally Vibratory sensation reactive bilaterally Ankle Reflex equal and reactive bilaterally Manual motor testing dorsi flexion, plantar flexion, inversion, eversion 5/5 bilaterally No restriction or crepitus on range of motion of ankle, subtalar, midtarsal joints bilaterally No open skin lesions bilaterally The toenails are hypertrophic, discolored, deformed, incurvated and tender to direct palpation 6-10 Absent hair growth bilaterally  Assessment: Symptomatic onychomycoses 6-10 Diabeticwithout foot complications Satisfactory neurovascular status  Plan: Debride toenails 10 mechanically and electronically without any bleeding  Reappoint 3 months

## 2017-04-21 DIAGNOSIS — Z1231 Encounter for screening mammogram for malignant neoplasm of breast: Secondary | ICD-10-CM | POA: Diagnosis not present

## 2017-04-21 DIAGNOSIS — M8589 Other specified disorders of bone density and structure, multiple sites: Secondary | ICD-10-CM | POA: Diagnosis not present

## 2017-04-21 DIAGNOSIS — Z780 Asymptomatic menopausal state: Secondary | ICD-10-CM | POA: Diagnosis not present

## 2017-05-24 ENCOUNTER — Ambulatory Visit (INDEPENDENT_AMBULATORY_CARE_PROVIDER_SITE_OTHER): Payer: Medicare Other | Admitting: Podiatry

## 2017-05-24 ENCOUNTER — Encounter: Payer: Self-pay | Admitting: Podiatry

## 2017-05-24 DIAGNOSIS — B351 Tinea unguium: Secondary | ICD-10-CM

## 2017-05-24 DIAGNOSIS — M79675 Pain in left toe(s): Secondary | ICD-10-CM

## 2017-05-24 DIAGNOSIS — M79674 Pain in right toe(s): Secondary | ICD-10-CM

## 2017-05-24 DIAGNOSIS — E119 Type 2 diabetes mellitus without complications: Secondary | ICD-10-CM

## 2017-05-26 NOTE — Progress Notes (Signed)
Subjective: 77 y.o. returns the office today for painful, elongated, thickened toenails which she cannot trim herself. Denies any redness or drainage around the nails. Denies any acute changes since last appointment and no new complaints today. Denies any systemic complaints such as fevers, chills, nausea, vomiting.   PCP: Shirline Frees, MD Reports last blood sugar of 135  Objective: AAO 3, NAD DP/PT pulses palpable 2/4, CRT less than 3 seconds Protective sensation intact with Derrel Nip monofilament Nails hypertrophic, dystrophic, elongated, brittle, discolored 10. There is tenderness overlying the nails 1-5 bilaterally. There is no surrounding erythema or drainage along the nail sites. No open lesions or pre-ulcerative lesions are identified. No other areas of tenderness bilateral lower extremities. No overlying edema, erythema, increased warmth. No pain with calf compression, swelling, warmth, erythema.  Assessment: Patient presents with symptomatic onychomycosis  Plan: -Treatment options including alternatives, risks, complications were discussed -Nails sharply debrided 10 without complication/bleeding. -Discussed daily foot inspection. If there are any changes, to call the office immediately.  -Follow-up in 3 months or sooner if any problems are to arise. In the meantime, encouraged to call the office with any questions, concerns, changes symptoms.  Celesta Gentile, DPM

## 2017-06-22 DIAGNOSIS — G4733 Obstructive sleep apnea (adult) (pediatric): Secondary | ICD-10-CM | POA: Diagnosis not present

## 2017-06-22 DIAGNOSIS — E7800 Pure hypercholesterolemia, unspecified: Secondary | ICD-10-CM | POA: Diagnosis not present

## 2017-06-22 DIAGNOSIS — K219 Gastro-esophageal reflux disease without esophagitis: Secondary | ICD-10-CM | POA: Diagnosis not present

## 2017-06-22 DIAGNOSIS — M1990 Unspecified osteoarthritis, unspecified site: Secondary | ICD-10-CM | POA: Diagnosis not present

## 2017-06-22 DIAGNOSIS — Z7984 Long term (current) use of oral hypoglycemic drugs: Secondary | ICD-10-CM | POA: Diagnosis not present

## 2017-06-22 DIAGNOSIS — H8110 Benign paroxysmal vertigo, unspecified ear: Secondary | ICD-10-CM | POA: Diagnosis not present

## 2017-06-22 DIAGNOSIS — Z6835 Body mass index (BMI) 35.0-35.9, adult: Secondary | ICD-10-CM | POA: Diagnosis not present

## 2017-06-22 DIAGNOSIS — E1142 Type 2 diabetes mellitus with diabetic polyneuropathy: Secondary | ICD-10-CM | POA: Diagnosis not present

## 2017-06-22 DIAGNOSIS — E559 Vitamin D deficiency, unspecified: Secondary | ICD-10-CM | POA: Diagnosis not present

## 2017-06-22 DIAGNOSIS — J449 Chronic obstructive pulmonary disease, unspecified: Secondary | ICD-10-CM | POA: Diagnosis not present

## 2017-08-24 ENCOUNTER — Ambulatory Visit (INDEPENDENT_AMBULATORY_CARE_PROVIDER_SITE_OTHER): Payer: Medicare Other | Admitting: Podiatry

## 2017-08-24 ENCOUNTER — Encounter: Payer: Self-pay | Admitting: Podiatry

## 2017-08-24 DIAGNOSIS — M79675 Pain in left toe(s): Secondary | ICD-10-CM

## 2017-08-24 DIAGNOSIS — R1900 Intra-abdominal and pelvic swelling, mass and lump, unspecified site: Secondary | ICD-10-CM | POA: Insufficient documentation

## 2017-08-24 DIAGNOSIS — M79674 Pain in right toe(s): Secondary | ICD-10-CM | POA: Diagnosis not present

## 2017-08-24 DIAGNOSIS — B351 Tinea unguium: Secondary | ICD-10-CM

## 2017-08-24 DIAGNOSIS — K649 Unspecified hemorrhoids: Secondary | ICD-10-CM | POA: Insufficient documentation

## 2017-08-24 DIAGNOSIS — Q524 Other congenital malformations of vagina: Secondary | ICD-10-CM | POA: Insufficient documentation

## 2017-08-26 NOTE — Progress Notes (Signed)
Subjective: 77 y.o. returns the office today for painful, elongated, thickened toenails which she cannot trim herself. Denies any redness or drainage around the nails.  She states that she becomes some pain to her foot at the start of the knee she states that he has pain to the entire leg.  She is scheduled to see orthopedics for this.  Denies any acute changes since last appointment and no new complaints today. Denies any systemic complaints such as fevers, chills, nausea, vomiting.   PCP: Shirline Frees, MD Reports last blood sugar of 135  Objective: AAO 3, NAD DP/PT pulses palpable 2/4, CRT less than 3 seconds Protective sensation intact with Derrel Nip monofilament Nails hypertrophic, dystrophic, elongated, brittle, discolored 10. There is tenderness overlying the nails 1-5 bilaterally. There is no surrounding erythema or drainage along the nail sites. No open lesions or pre-ulcerative lesions are identified. No other areas of tenderness bilateral lower extremities. No overlying edema, erythema, increased warmth. No pain with calf compression, swelling, warmth, erythema.  Assessment: Patient presents with symptomatic onychomycosis  Plan: -Treatment options including alternatives, risks, complications were discussed -Nails sharply debrided 10 without complication/bleeding. -She is given some generalized achiness to her feet.  She is scheduled to see orthopedics.  Should she need to see me for this I will be more than happy to get x-rays and treat this however she states that she has an upcoming appointment.  We will see what orthopedics says that she needs my help I will be more than happy to help her with this regards to her feet. -Discussed daily foot inspection. If there are any changes, to call the office immediately.  -Follow-up in 3 months or sooner if any problems are to arise. In the meantime, encouraged to call the office with any questions, concerns, changes  symptoms.  Celesta Gentile, DPM

## 2017-09-03 DIAGNOSIS — L2089 Other atopic dermatitis: Secondary | ICD-10-CM | POA: Diagnosis not present

## 2017-09-03 DIAGNOSIS — Z85828 Personal history of other malignant neoplasm of skin: Secondary | ICD-10-CM | POA: Diagnosis not present

## 2017-09-16 DIAGNOSIS — M25552 Pain in left hip: Secondary | ICD-10-CM | POA: Diagnosis not present

## 2017-09-16 DIAGNOSIS — S83242A Other tear of medial meniscus, current injury, left knee, initial encounter: Secondary | ICD-10-CM | POA: Diagnosis not present

## 2017-09-16 DIAGNOSIS — M545 Low back pain: Secondary | ICD-10-CM | POA: Diagnosis not present

## 2017-10-28 DIAGNOSIS — M23232 Derangement of other medial meniscus due to old tear or injury, left knee: Secondary | ICD-10-CM | POA: Diagnosis not present

## 2017-10-28 DIAGNOSIS — M5432 Sciatica, left side: Secondary | ICD-10-CM | POA: Diagnosis not present

## 2017-10-28 DIAGNOSIS — M25562 Pain in left knee: Secondary | ICD-10-CM | POA: Diagnosis not present

## 2017-11-23 ENCOUNTER — Ambulatory Visit: Payer: Medicare Other | Admitting: Podiatry

## 2018-01-03 DIAGNOSIS — E1142 Type 2 diabetes mellitus with diabetic polyneuropathy: Secondary | ICD-10-CM | POA: Diagnosis not present

## 2018-01-03 DIAGNOSIS — J449 Chronic obstructive pulmonary disease, unspecified: Secondary | ICD-10-CM | POA: Diagnosis not present

## 2018-01-03 DIAGNOSIS — Z1389 Encounter for screening for other disorder: Secondary | ICD-10-CM | POA: Diagnosis not present

## 2018-01-03 DIAGNOSIS — E559 Vitamin D deficiency, unspecified: Secondary | ICD-10-CM | POA: Diagnosis not present

## 2018-01-03 DIAGNOSIS — E7800 Pure hypercholesterolemia, unspecified: Secondary | ICD-10-CM | POA: Diagnosis not present

## 2018-01-03 DIAGNOSIS — J4520 Mild intermittent asthma, uncomplicated: Secondary | ICD-10-CM | POA: Diagnosis not present

## 2018-01-03 DIAGNOSIS — M5442 Lumbago with sciatica, left side: Secondary | ICD-10-CM | POA: Diagnosis not present

## 2018-01-03 DIAGNOSIS — K219 Gastro-esophageal reflux disease without esophagitis: Secondary | ICD-10-CM | POA: Diagnosis not present

## 2018-01-20 ENCOUNTER — Encounter

## 2018-01-20 ENCOUNTER — Ambulatory Visit (INDEPENDENT_AMBULATORY_CARE_PROVIDER_SITE_OTHER): Payer: Medicare Other | Admitting: Podiatry

## 2018-01-20 ENCOUNTER — Ambulatory Visit (INDEPENDENT_AMBULATORY_CARE_PROVIDER_SITE_OTHER): Payer: Medicare Other

## 2018-01-20 DIAGNOSIS — B351 Tinea unguium: Secondary | ICD-10-CM

## 2018-01-20 DIAGNOSIS — E119 Type 2 diabetes mellitus without complications: Secondary | ICD-10-CM

## 2018-01-20 DIAGNOSIS — M1990 Unspecified osteoarthritis, unspecified site: Secondary | ICD-10-CM

## 2018-01-20 DIAGNOSIS — M7752 Other enthesopathy of left foot: Secondary | ICD-10-CM | POA: Diagnosis not present

## 2018-01-20 DIAGNOSIS — M79675 Pain in left toe(s): Secondary | ICD-10-CM

## 2018-01-20 DIAGNOSIS — M79674 Pain in right toe(s): Secondary | ICD-10-CM

## 2018-01-20 DIAGNOSIS — M79672 Pain in left foot: Secondary | ICD-10-CM

## 2018-01-21 ENCOUNTER — Other Ambulatory Visit: Payer: Self-pay | Admitting: Podiatry

## 2018-01-21 DIAGNOSIS — M1990 Unspecified osteoarthritis, unspecified site: Secondary | ICD-10-CM

## 2018-01-23 NOTE — Progress Notes (Signed)
Subjective: 77 y.o. returns the office today for painful, elongated, thickened toenails which she cannot trim herself.  She denies any drainage or pus coming from the area.  She also has concerns of pain to her left big trouble she points the medial aspect of her big toe which is the majority of symptoms.  This is been a chronic issue for ongoing for greater than 1 year.  There is painful with pressure.  She has no other concerns today. Denies any systemic complaints such as fevers, chills, nausea, vomiting.   PCP: Shirline Frees, MD  Objective: AAO 3, NAD DP/PT pulses palpable 2/4, CRT less than 3 seconds Protective sensation intact with Derrel Nip monofilament Nails hypertrophic, dystrophic, elongated, brittle, discolored 10. There is tenderness overlying the nails 1-5 bilaterally. There is no surrounding erythema or drainage along the nail sites. Tenderness the medial aspect the left hallux on the IPJ there is a prominent bone spur present at this site compared to contralateral extremity.  There is no skin breakdown, callus formation.  No erythema or warmth. No open lesions or pre-ulcerative lesions are identified. No pain with calf compression, swelling, warmth, erythema.  Assessment: Patient presents with symptomatic onychomycosis; left hallux bone spur  Plan: -Treatment options including alternatives, risks, complications were discussed -Nails sharply debrided 10 without complication/bleeding. -X-rays were obtained and reviewed.  No evidence of acute fracture however there is a significant prominence, bone spur to the medial aspect of the distal phalanx on the IPJ. -Given the pain dispensed offloading pads we discussed shoe modifications as well.  Trula Slade DPM

## 2018-01-31 DIAGNOSIS — Z961 Presence of intraocular lens: Secondary | ICD-10-CM | POA: Diagnosis not present

## 2018-01-31 DIAGNOSIS — E119 Type 2 diabetes mellitus without complications: Secondary | ICD-10-CM | POA: Diagnosis not present

## 2018-01-31 DIAGNOSIS — H35033 Hypertensive retinopathy, bilateral: Secondary | ICD-10-CM | POA: Diagnosis not present

## 2018-01-31 DIAGNOSIS — H43813 Vitreous degeneration, bilateral: Secondary | ICD-10-CM | POA: Diagnosis not present

## 2018-03-09 DIAGNOSIS — L2089 Other atopic dermatitis: Secondary | ICD-10-CM | POA: Diagnosis not present

## 2018-03-09 DIAGNOSIS — Z85828 Personal history of other malignant neoplasm of skin: Secondary | ICD-10-CM | POA: Diagnosis not present

## 2018-04-14 DIAGNOSIS — R350 Frequency of micturition: Secondary | ICD-10-CM | POA: Diagnosis not present

## 2018-04-14 DIAGNOSIS — N3000 Acute cystitis without hematuria: Secondary | ICD-10-CM | POA: Diagnosis not present

## 2018-04-21 ENCOUNTER — Ambulatory Visit: Payer: Medicare Other | Admitting: Podiatry

## 2018-05-16 DIAGNOSIS — R300 Dysuria: Secondary | ICD-10-CM | POA: Diagnosis not present

## 2018-05-16 DIAGNOSIS — M5416 Radiculopathy, lumbar region: Secondary | ICD-10-CM | POA: Diagnosis not present

## 2018-05-16 DIAGNOSIS — G4730 Sleep apnea, unspecified: Secondary | ICD-10-CM | POA: Diagnosis not present

## 2018-05-16 DIAGNOSIS — K219 Gastro-esophageal reflux disease without esophagitis: Secondary | ICD-10-CM | POA: Diagnosis not present

## 2018-05-16 DIAGNOSIS — N3281 Overactive bladder: Secondary | ICD-10-CM | POA: Diagnosis not present

## 2018-05-18 ENCOUNTER — Other Ambulatory Visit: Payer: Self-pay | Admitting: Family Medicine

## 2018-05-18 DIAGNOSIS — M5416 Radiculopathy, lumbar region: Secondary | ICD-10-CM

## 2018-05-25 DIAGNOSIS — Z1231 Encounter for screening mammogram for malignant neoplasm of breast: Secondary | ICD-10-CM | POA: Diagnosis not present

## 2018-05-30 ENCOUNTER — Other Ambulatory Visit: Payer: Federal, State, Local not specified - PPO

## 2018-06-09 ENCOUNTER — Ambulatory Visit
Admission: RE | Admit: 2018-06-09 | Discharge: 2018-06-09 | Disposition: A | Discharge status: Home / Self Care | Payer: Medicare Other | Source: Ambulatory Visit | Attending: Family Medicine | Admitting: Family Medicine

## 2018-06-09 DIAGNOSIS — M4807 Spinal stenosis, lumbosacral region: Secondary | ICD-10-CM | POA: Diagnosis not present

## 2018-06-09 DIAGNOSIS — M5117 Intervertebral disc disorders with radiculopathy, lumbosacral region: Secondary | ICD-10-CM | POA: Diagnosis not present

## 2018-06-09 DIAGNOSIS — M5416 Radiculopathy, lumbar region: Secondary | ICD-10-CM

## 2018-07-06 ENCOUNTER — Ambulatory Visit: Payer: Medicare Other | Admitting: Podiatry

## 2018-07-20 DIAGNOSIS — M5416 Radiculopathy, lumbar region: Secondary | ICD-10-CM | POA: Diagnosis not present

## 2018-07-26 ENCOUNTER — Other Ambulatory Visit: Payer: Self-pay | Admitting: Physician Assistant

## 2018-07-26 DIAGNOSIS — M5416 Radiculopathy, lumbar region: Secondary | ICD-10-CM

## 2018-08-02 ENCOUNTER — Ambulatory Visit: Payer: Medicare Other | Admitting: Podiatry

## 2018-08-08 DIAGNOSIS — J4520 Mild intermittent asthma, uncomplicated: Secondary | ICD-10-CM | POA: Diagnosis not present

## 2018-08-08 DIAGNOSIS — N3281 Overactive bladder: Secondary | ICD-10-CM | POA: Diagnosis not present

## 2018-08-08 DIAGNOSIS — K219 Gastro-esophageal reflux disease without esophagitis: Secondary | ICD-10-CM | POA: Diagnosis not present

## 2018-08-08 DIAGNOSIS — E559 Vitamin D deficiency, unspecified: Secondary | ICD-10-CM | POA: Diagnosis not present

## 2018-08-08 DIAGNOSIS — G4730 Sleep apnea, unspecified: Secondary | ICD-10-CM | POA: Diagnosis not present

## 2018-08-08 DIAGNOSIS — E1142 Type 2 diabetes mellitus with diabetic polyneuropathy: Secondary | ICD-10-CM | POA: Diagnosis not present

## 2018-08-08 DIAGNOSIS — E7800 Pure hypercholesterolemia, unspecified: Secondary | ICD-10-CM | POA: Diagnosis not present

## 2018-08-31 ENCOUNTER — Telehealth: Payer: Self-pay | Admitting: Nurse Practitioner

## 2018-08-31 NOTE — Telephone Encounter (Signed)
Phone call to patient to verify medication list and allergies for myelogram procedure. Pt instructed to hold Reglan for 48hrs prior to myelogram appointment time. Pt verbalized understanding. Pre and post procedure instructions reviewed with pt.

## 2018-09-08 DIAGNOSIS — Z85828 Personal history of other malignant neoplasm of skin: Secondary | ICD-10-CM | POA: Diagnosis not present

## 2018-09-08 DIAGNOSIS — L918 Other hypertrophic disorders of the skin: Secondary | ICD-10-CM | POA: Diagnosis not present

## 2018-09-08 DIAGNOSIS — L821 Other seborrheic keratosis: Secondary | ICD-10-CM | POA: Diagnosis not present

## 2018-09-14 ENCOUNTER — Encounter: Payer: Self-pay | Admitting: Podiatry

## 2018-09-14 ENCOUNTER — Ambulatory Visit (INDEPENDENT_AMBULATORY_CARE_PROVIDER_SITE_OTHER): Payer: Medicare Other | Admitting: Podiatry

## 2018-09-14 ENCOUNTER — Other Ambulatory Visit: Payer: Self-pay

## 2018-09-14 VITALS — Temp 97.2°F

## 2018-09-14 DIAGNOSIS — M79675 Pain in left toe(s): Secondary | ICD-10-CM | POA: Diagnosis not present

## 2018-09-14 DIAGNOSIS — E119 Type 2 diabetes mellitus without complications: Secondary | ICD-10-CM | POA: Diagnosis not present

## 2018-09-14 DIAGNOSIS — B351 Tinea unguium: Secondary | ICD-10-CM

## 2018-09-14 DIAGNOSIS — M79674 Pain in right toe(s): Secondary | ICD-10-CM

## 2018-09-14 NOTE — Progress Notes (Signed)
Complaint:  Visit Type: Patient returns to my office for continued preventative foot care services. Complaint: Patient states" my nails have grown long and thick and become painful to walk and wear shoes" Patient has been diagnosed with DM with no foot complications. The patient presents for preventative foot care services. No changes to ROS  Podiatric Exam: Vascular: dorsalis pedis and posterior tibial pulses are palpable bilateral. Capillary return is immediate. Temperature gradient is WNL. Skin turgor WNL  Sensorium: Normal Semmes Weinstein monofilament test. Normal tactile sensation bilaterally. Nail Exam: Pt has thick disfigured discolored nails with subungual debris noted bilateral entire nail hallux through fifth toenails Ulcer Exam: There is no evidence of ulcer or pre-ulcerative changes or infection. Orthopedic Exam: Muscle tone and strength are WNL. No limitations in general ROM. No crepitus or effusions noted. Foot type and digits show no abnormalities. Bony prominences are unremarkable. Skin: No Porokeratosis. No infection or ulcers  Diagnosis:  Onychomycosis, , Pain in right toe, pain in left toes  Treatment & Plan Procedures and Treatment: Consent by patient was obtained for treatment procedures.   Debridement of mycotic and hypertrophic toenails, 1 through 5 bilateral and clearing of subungual debris. No ulceration, no infection noted.  Return Visit-Office Procedure: Patient instructed to return to the office for a follow up visit 3 months for continued evaluation and treatment.    Terence Googe DPM 

## 2018-09-18 DIAGNOSIS — M25531 Pain in right wrist: Secondary | ICD-10-CM | POA: Diagnosis not present

## 2018-09-20 DIAGNOSIS — M25531 Pain in right wrist: Secondary | ICD-10-CM | POA: Diagnosis not present

## 2018-09-27 DIAGNOSIS — M25531 Pain in right wrist: Secondary | ICD-10-CM | POA: Diagnosis not present

## 2018-09-28 ENCOUNTER — Other Ambulatory Visit: Payer: Federal, State, Local not specified - PPO

## 2018-11-01 DIAGNOSIS — M25531 Pain in right wrist: Secondary | ICD-10-CM | POA: Diagnosis not present

## 2018-11-16 DIAGNOSIS — N390 Urinary tract infection, site not specified: Secondary | ICD-10-CM | POA: Diagnosis not present

## 2018-11-16 DIAGNOSIS — G4730 Sleep apnea, unspecified: Secondary | ICD-10-CM | POA: Diagnosis not present

## 2018-11-16 DIAGNOSIS — E1142 Type 2 diabetes mellitus with diabetic polyneuropathy: Secondary | ICD-10-CM | POA: Diagnosis not present

## 2018-11-16 DIAGNOSIS — J4520 Mild intermittent asthma, uncomplicated: Secondary | ICD-10-CM | POA: Diagnosis not present

## 2018-11-16 DIAGNOSIS — N3281 Overactive bladder: Secondary | ICD-10-CM | POA: Diagnosis not present

## 2018-11-16 DIAGNOSIS — E7800 Pure hypercholesterolemia, unspecified: Secondary | ICD-10-CM | POA: Diagnosis not present

## 2018-11-16 DIAGNOSIS — R300 Dysuria: Secondary | ICD-10-CM | POA: Diagnosis not present

## 2018-11-16 DIAGNOSIS — Z7984 Long term (current) use of oral hypoglycemic drugs: Secondary | ICD-10-CM | POA: Diagnosis not present

## 2018-11-16 DIAGNOSIS — E559 Vitamin D deficiency, unspecified: Secondary | ICD-10-CM | POA: Diagnosis not present

## 2018-11-16 DIAGNOSIS — G4733 Obstructive sleep apnea (adult) (pediatric): Secondary | ICD-10-CM | POA: Diagnosis not present

## 2018-11-16 DIAGNOSIS — H35033 Hypertensive retinopathy, bilateral: Secondary | ICD-10-CM | POA: Diagnosis not present

## 2018-11-22 DIAGNOSIS — Z7984 Long term (current) use of oral hypoglycemic drugs: Secondary | ICD-10-CM | POA: Diagnosis not present

## 2018-11-22 DIAGNOSIS — E1142 Type 2 diabetes mellitus with diabetic polyneuropathy: Secondary | ICD-10-CM | POA: Diagnosis not present

## 2018-11-22 DIAGNOSIS — E7800 Pure hypercholesterolemia, unspecified: Secondary | ICD-10-CM | POA: Diagnosis not present

## 2018-11-22 DIAGNOSIS — E119 Type 2 diabetes mellitus without complications: Secondary | ICD-10-CM | POA: Diagnosis not present

## 2018-11-29 DIAGNOSIS — M25532 Pain in left wrist: Secondary | ICD-10-CM | POA: Diagnosis not present

## 2018-12-06 DIAGNOSIS — M25641 Stiffness of right hand, not elsewhere classified: Secondary | ICD-10-CM | POA: Diagnosis not present

## 2018-12-06 DIAGNOSIS — M25541 Pain in joints of right hand: Secondary | ICD-10-CM | POA: Diagnosis not present

## 2018-12-06 DIAGNOSIS — M25631 Stiffness of right wrist, not elsewhere classified: Secondary | ICD-10-CM | POA: Diagnosis not present

## 2018-12-06 DIAGNOSIS — M6281 Muscle weakness (generalized): Secondary | ICD-10-CM | POA: Diagnosis not present

## 2018-12-06 DIAGNOSIS — M25531 Pain in right wrist: Secondary | ICD-10-CM | POA: Diagnosis not present

## 2018-12-06 DIAGNOSIS — W01198S Fall on same level from slipping, tripping and stumbling with subsequent striking against other object, sequela: Secondary | ICD-10-CM | POA: Diagnosis not present

## 2018-12-06 DIAGNOSIS — S52509S Unspecified fracture of the lower end of unspecified radius, sequela: Secondary | ICD-10-CM | POA: Diagnosis not present

## 2018-12-08 DIAGNOSIS — M25531 Pain in right wrist: Secondary | ICD-10-CM | POA: Diagnosis not present

## 2018-12-08 DIAGNOSIS — S52509S Unspecified fracture of the lower end of unspecified radius, sequela: Secondary | ICD-10-CM | POA: Diagnosis not present

## 2018-12-08 DIAGNOSIS — M25541 Pain in joints of right hand: Secondary | ICD-10-CM | POA: Diagnosis not present

## 2018-12-08 DIAGNOSIS — M25631 Stiffness of right wrist, not elsewhere classified: Secondary | ICD-10-CM | POA: Diagnosis not present

## 2018-12-08 DIAGNOSIS — W01198S Fall on same level from slipping, tripping and stumbling with subsequent striking against other object, sequela: Secondary | ICD-10-CM | POA: Diagnosis not present

## 2018-12-08 DIAGNOSIS — M6281 Muscle weakness (generalized): Secondary | ICD-10-CM | POA: Diagnosis not present

## 2018-12-08 DIAGNOSIS — M25641 Stiffness of right hand, not elsewhere classified: Secondary | ICD-10-CM | POA: Diagnosis not present

## 2018-12-13 DIAGNOSIS — M25641 Stiffness of right hand, not elsewhere classified: Secondary | ICD-10-CM | POA: Diagnosis not present

## 2018-12-13 DIAGNOSIS — M25531 Pain in right wrist: Secondary | ICD-10-CM | POA: Diagnosis not present

## 2018-12-13 DIAGNOSIS — M25541 Pain in joints of right hand: Secondary | ICD-10-CM | POA: Diagnosis not present

## 2018-12-13 DIAGNOSIS — M25631 Stiffness of right wrist, not elsewhere classified: Secondary | ICD-10-CM | POA: Diagnosis not present

## 2018-12-13 DIAGNOSIS — S52509S Unspecified fracture of the lower end of unspecified radius, sequela: Secondary | ICD-10-CM | POA: Diagnosis not present

## 2018-12-13 DIAGNOSIS — M6281 Muscle weakness (generalized): Secondary | ICD-10-CM | POA: Diagnosis not present

## 2018-12-13 DIAGNOSIS — W01198S Fall on same level from slipping, tripping and stumbling with subsequent striking against other object, sequela: Secondary | ICD-10-CM | POA: Diagnosis not present

## 2018-12-15 DIAGNOSIS — S52509S Unspecified fracture of the lower end of unspecified radius, sequela: Secondary | ICD-10-CM | POA: Diagnosis not present

## 2018-12-15 DIAGNOSIS — M25541 Pain in joints of right hand: Secondary | ICD-10-CM | POA: Diagnosis not present

## 2018-12-15 DIAGNOSIS — M25641 Stiffness of right hand, not elsewhere classified: Secondary | ICD-10-CM | POA: Diagnosis not present

## 2018-12-15 DIAGNOSIS — W01198S Fall on same level from slipping, tripping and stumbling with subsequent striking against other object, sequela: Secondary | ICD-10-CM | POA: Diagnosis not present

## 2018-12-15 DIAGNOSIS — M6281 Muscle weakness (generalized): Secondary | ICD-10-CM | POA: Diagnosis not present

## 2018-12-15 DIAGNOSIS — M25531 Pain in right wrist: Secondary | ICD-10-CM | POA: Diagnosis not present

## 2018-12-15 DIAGNOSIS — M25631 Stiffness of right wrist, not elsewhere classified: Secondary | ICD-10-CM | POA: Diagnosis not present

## 2018-12-20 ENCOUNTER — Other Ambulatory Visit: Payer: Self-pay

## 2018-12-20 ENCOUNTER — Encounter: Payer: Self-pay | Admitting: Podiatry

## 2018-12-20 ENCOUNTER — Ambulatory Visit (INDEPENDENT_AMBULATORY_CARE_PROVIDER_SITE_OTHER): Payer: Medicare Other | Admitting: Podiatry

## 2018-12-20 DIAGNOSIS — M79674 Pain in right toe(s): Secondary | ICD-10-CM | POA: Diagnosis not present

## 2018-12-20 DIAGNOSIS — M79675 Pain in left toe(s): Secondary | ICD-10-CM

## 2018-12-20 DIAGNOSIS — B351 Tinea unguium: Secondary | ICD-10-CM

## 2018-12-20 DIAGNOSIS — E119 Type 2 diabetes mellitus without complications: Secondary | ICD-10-CM

## 2018-12-20 NOTE — Progress Notes (Signed)
Complaint:  Visit Type: Patient returns to my office for continued preventative foot care services. Complaint: Patient states" my nails have grown long and thick and become painful to walk and wear shoes" Patient has been diagnosed with DM with no foot complications. The patient presents for preventative foot care services. No changes to ROS  Podiatric Exam: Vascular: dorsalis pedis and posterior tibial pulses are palpable bilateral. Capillary return is immediate. Temperature gradient is WNL. Skin turgor WNL  Sensorium: Normal Semmes Weinstein monofilament test. Normal tactile sensation bilaterally. Nail Exam: Pt has thick disfigured discolored nails with subungual debris noted bilateral entire nail hallux through fifth toenails Ulcer Exam: There is no evidence of ulcer or pre-ulcerative changes or infection. Orthopedic Exam: Muscle tone and strength are WNL. No limitations in general ROM. No crepitus or effusions noted. Foot type and digits show no abnormalities. Bony prominences are unremarkable. Skin: No Porokeratosis. No infection or ulcers  Diagnosis:  Onychomycosis, , Pain in right toe, pain in left toes  Treatment & Plan Procedures and Treatment: Consent by patient was obtained for treatment procedures.   Debridement of mycotic and hypertrophic toenails, 1 through 5 bilateral and clearing of subungual debris. No ulceration, no infection noted.  Return Visit-Office Procedure: Patient instructed to return to the office for a follow up visit 3 months for continued evaluation and treatment.    Nainoa Woldt DPM 

## 2018-12-22 DIAGNOSIS — M25641 Stiffness of right hand, not elsewhere classified: Secondary | ICD-10-CM | POA: Diagnosis not present

## 2018-12-22 DIAGNOSIS — M25631 Stiffness of right wrist, not elsewhere classified: Secondary | ICD-10-CM | POA: Diagnosis not present

## 2018-12-22 DIAGNOSIS — M25541 Pain in joints of right hand: Secondary | ICD-10-CM | POA: Diagnosis not present

## 2018-12-22 DIAGNOSIS — M6281 Muscle weakness (generalized): Secondary | ICD-10-CM | POA: Diagnosis not present

## 2018-12-22 DIAGNOSIS — M25531 Pain in right wrist: Secondary | ICD-10-CM | POA: Diagnosis not present

## 2018-12-22 DIAGNOSIS — S52509S Unspecified fracture of the lower end of unspecified radius, sequela: Secondary | ICD-10-CM | POA: Diagnosis not present

## 2018-12-22 DIAGNOSIS — W01198S Fall on same level from slipping, tripping and stumbling with subsequent striking against other object, sequela: Secondary | ICD-10-CM | POA: Diagnosis not present

## 2018-12-27 DIAGNOSIS — M25541 Pain in joints of right hand: Secondary | ICD-10-CM | POA: Diagnosis not present

## 2018-12-27 DIAGNOSIS — M25641 Stiffness of right hand, not elsewhere classified: Secondary | ICD-10-CM | POA: Diagnosis not present

## 2018-12-27 DIAGNOSIS — M25531 Pain in right wrist: Secondary | ICD-10-CM | POA: Diagnosis not present

## 2018-12-27 DIAGNOSIS — W01198S Fall on same level from slipping, tripping and stumbling with subsequent striking against other object, sequela: Secondary | ICD-10-CM | POA: Diagnosis not present

## 2018-12-27 DIAGNOSIS — M25631 Stiffness of right wrist, not elsewhere classified: Secondary | ICD-10-CM | POA: Diagnosis not present

## 2018-12-27 DIAGNOSIS — S52509S Unspecified fracture of the lower end of unspecified radius, sequela: Secondary | ICD-10-CM | POA: Diagnosis not present

## 2018-12-27 DIAGNOSIS — M6281 Muscle weakness (generalized): Secondary | ICD-10-CM | POA: Diagnosis not present

## 2018-12-29 DIAGNOSIS — M25641 Stiffness of right hand, not elsewhere classified: Secondary | ICD-10-CM | POA: Diagnosis not present

## 2018-12-29 DIAGNOSIS — M25531 Pain in right wrist: Secondary | ICD-10-CM | POA: Diagnosis not present

## 2018-12-29 DIAGNOSIS — M25631 Stiffness of right wrist, not elsewhere classified: Secondary | ICD-10-CM | POA: Diagnosis not present

## 2018-12-29 DIAGNOSIS — M25541 Pain in joints of right hand: Secondary | ICD-10-CM | POA: Diagnosis not present

## 2018-12-29 DIAGNOSIS — W01198S Fall on same level from slipping, tripping and stumbling with subsequent striking against other object, sequela: Secondary | ICD-10-CM | POA: Diagnosis not present

## 2018-12-29 DIAGNOSIS — S52509S Unspecified fracture of the lower end of unspecified radius, sequela: Secondary | ICD-10-CM | POA: Diagnosis not present

## 2018-12-29 DIAGNOSIS — M6281 Muscle weakness (generalized): Secondary | ICD-10-CM | POA: Diagnosis not present

## 2019-01-03 DIAGNOSIS — M25641 Stiffness of right hand, not elsewhere classified: Secondary | ICD-10-CM | POA: Diagnosis not present

## 2019-01-03 DIAGNOSIS — W01198S Fall on same level from slipping, tripping and stumbling with subsequent striking against other object, sequela: Secondary | ICD-10-CM | POA: Diagnosis not present

## 2019-01-03 DIAGNOSIS — S52509S Unspecified fracture of the lower end of unspecified radius, sequela: Secondary | ICD-10-CM | POA: Diagnosis not present

## 2019-01-03 DIAGNOSIS — M25541 Pain in joints of right hand: Secondary | ICD-10-CM | POA: Diagnosis not present

## 2019-01-03 DIAGNOSIS — M25631 Stiffness of right wrist, not elsewhere classified: Secondary | ICD-10-CM | POA: Diagnosis not present

## 2019-01-03 DIAGNOSIS — M6281 Muscle weakness (generalized): Secondary | ICD-10-CM | POA: Diagnosis not present

## 2019-01-03 DIAGNOSIS — M25531 Pain in right wrist: Secondary | ICD-10-CM | POA: Diagnosis not present

## 2019-01-05 DIAGNOSIS — M25641 Stiffness of right hand, not elsewhere classified: Secondary | ICD-10-CM | POA: Diagnosis not present

## 2019-01-05 DIAGNOSIS — M25541 Pain in joints of right hand: Secondary | ICD-10-CM | POA: Diagnosis not present

## 2019-01-05 DIAGNOSIS — M6281 Muscle weakness (generalized): Secondary | ICD-10-CM | POA: Diagnosis not present

## 2019-01-05 DIAGNOSIS — M25631 Stiffness of right wrist, not elsewhere classified: Secondary | ICD-10-CM | POA: Diagnosis not present

## 2019-01-05 DIAGNOSIS — W01198S Fall on same level from slipping, tripping and stumbling with subsequent striking against other object, sequela: Secondary | ICD-10-CM | POA: Diagnosis not present

## 2019-01-05 DIAGNOSIS — M25531 Pain in right wrist: Secondary | ICD-10-CM | POA: Diagnosis not present

## 2019-01-05 DIAGNOSIS — S52509S Unspecified fracture of the lower end of unspecified radius, sequela: Secondary | ICD-10-CM | POA: Diagnosis not present

## 2019-01-10 DIAGNOSIS — M25531 Pain in right wrist: Secondary | ICD-10-CM | POA: Diagnosis not present

## 2019-01-10 DIAGNOSIS — W01198S Fall on same level from slipping, tripping and stumbling with subsequent striking against other object, sequela: Secondary | ICD-10-CM | POA: Diagnosis not present

## 2019-01-10 DIAGNOSIS — M25641 Stiffness of right hand, not elsewhere classified: Secondary | ICD-10-CM | POA: Diagnosis not present

## 2019-01-10 DIAGNOSIS — M25541 Pain in joints of right hand: Secondary | ICD-10-CM | POA: Diagnosis not present

## 2019-01-10 DIAGNOSIS — M6281 Muscle weakness (generalized): Secondary | ICD-10-CM | POA: Diagnosis not present

## 2019-01-10 DIAGNOSIS — M25631 Stiffness of right wrist, not elsewhere classified: Secondary | ICD-10-CM | POA: Diagnosis not present

## 2019-01-10 DIAGNOSIS — S52509S Unspecified fracture of the lower end of unspecified radius, sequela: Secondary | ICD-10-CM | POA: Diagnosis not present

## 2019-01-12 DIAGNOSIS — M25531 Pain in right wrist: Secondary | ICD-10-CM | POA: Diagnosis not present

## 2019-01-12 DIAGNOSIS — W01198S Fall on same level from slipping, tripping and stumbling with subsequent striking against other object, sequela: Secondary | ICD-10-CM | POA: Diagnosis not present

## 2019-01-12 DIAGNOSIS — M25641 Stiffness of right hand, not elsewhere classified: Secondary | ICD-10-CM | POA: Diagnosis not present

## 2019-01-12 DIAGNOSIS — M25631 Stiffness of right wrist, not elsewhere classified: Secondary | ICD-10-CM | POA: Diagnosis not present

## 2019-01-12 DIAGNOSIS — M6281 Muscle weakness (generalized): Secondary | ICD-10-CM | POA: Diagnosis not present

## 2019-01-12 DIAGNOSIS — S52509S Unspecified fracture of the lower end of unspecified radius, sequela: Secondary | ICD-10-CM | POA: Diagnosis not present

## 2019-01-12 DIAGNOSIS — M25541 Pain in joints of right hand: Secondary | ICD-10-CM | POA: Diagnosis not present

## 2019-01-19 DIAGNOSIS — M25631 Stiffness of right wrist, not elsewhere classified: Secondary | ICD-10-CM | POA: Diagnosis not present

## 2019-01-19 DIAGNOSIS — M25541 Pain in joints of right hand: Secondary | ICD-10-CM | POA: Diagnosis not present

## 2019-01-19 DIAGNOSIS — S52509S Unspecified fracture of the lower end of unspecified radius, sequela: Secondary | ICD-10-CM | POA: Diagnosis not present

## 2019-01-19 DIAGNOSIS — M25641 Stiffness of right hand, not elsewhere classified: Secondary | ICD-10-CM | POA: Diagnosis not present

## 2019-01-19 DIAGNOSIS — M6281 Muscle weakness (generalized): Secondary | ICD-10-CM | POA: Diagnosis not present

## 2019-01-19 DIAGNOSIS — W01198S Fall on same level from slipping, tripping and stumbling with subsequent striking against other object, sequela: Secondary | ICD-10-CM | POA: Diagnosis not present

## 2019-01-19 DIAGNOSIS — M25531 Pain in right wrist: Secondary | ICD-10-CM | POA: Diagnosis not present

## 2019-01-26 DIAGNOSIS — S52509S Unspecified fracture of the lower end of unspecified radius, sequela: Secondary | ICD-10-CM | POA: Diagnosis not present

## 2019-01-26 DIAGNOSIS — M25541 Pain in joints of right hand: Secondary | ICD-10-CM | POA: Diagnosis not present

## 2019-01-26 DIAGNOSIS — M25641 Stiffness of right hand, not elsewhere classified: Secondary | ICD-10-CM | POA: Diagnosis not present

## 2019-01-26 DIAGNOSIS — M25531 Pain in right wrist: Secondary | ICD-10-CM | POA: Diagnosis not present

## 2019-01-26 DIAGNOSIS — M6281 Muscle weakness (generalized): Secondary | ICD-10-CM | POA: Diagnosis not present

## 2019-01-26 DIAGNOSIS — M25631 Stiffness of right wrist, not elsewhere classified: Secondary | ICD-10-CM | POA: Diagnosis not present

## 2019-01-26 DIAGNOSIS — W01198S Fall on same level from slipping, tripping and stumbling with subsequent striking against other object, sequela: Secondary | ICD-10-CM | POA: Diagnosis not present

## 2019-01-31 DIAGNOSIS — M25631 Stiffness of right wrist, not elsewhere classified: Secondary | ICD-10-CM | POA: Diagnosis not present

## 2019-01-31 DIAGNOSIS — S52509S Unspecified fracture of the lower end of unspecified radius, sequela: Secondary | ICD-10-CM | POA: Diagnosis not present

## 2019-01-31 DIAGNOSIS — M25531 Pain in right wrist: Secondary | ICD-10-CM | POA: Diagnosis not present

## 2019-01-31 DIAGNOSIS — W01198S Fall on same level from slipping, tripping and stumbling with subsequent striking against other object, sequela: Secondary | ICD-10-CM | POA: Diagnosis not present

## 2019-01-31 DIAGNOSIS — M25541 Pain in joints of right hand: Secondary | ICD-10-CM | POA: Diagnosis not present

## 2019-01-31 DIAGNOSIS — M25641 Stiffness of right hand, not elsewhere classified: Secondary | ICD-10-CM | POA: Diagnosis not present

## 2019-01-31 DIAGNOSIS — M6281 Muscle weakness (generalized): Secondary | ICD-10-CM | POA: Diagnosis not present

## 2019-02-07 DIAGNOSIS — M25631 Stiffness of right wrist, not elsewhere classified: Secondary | ICD-10-CM | POA: Diagnosis not present

## 2019-02-07 DIAGNOSIS — M25541 Pain in joints of right hand: Secondary | ICD-10-CM | POA: Diagnosis not present

## 2019-02-07 DIAGNOSIS — W01198S Fall on same level from slipping, tripping and stumbling with subsequent striking against other object, sequela: Secondary | ICD-10-CM | POA: Diagnosis not present

## 2019-02-07 DIAGNOSIS — M25531 Pain in right wrist: Secondary | ICD-10-CM | POA: Diagnosis not present

## 2019-02-07 DIAGNOSIS — M6281 Muscle weakness (generalized): Secondary | ICD-10-CM | POA: Diagnosis not present

## 2019-02-07 DIAGNOSIS — M25641 Stiffness of right hand, not elsewhere classified: Secondary | ICD-10-CM | POA: Diagnosis not present

## 2019-02-07 DIAGNOSIS — S52509S Unspecified fracture of the lower end of unspecified radius, sequela: Secondary | ICD-10-CM | POA: Diagnosis not present

## 2019-02-09 DIAGNOSIS — M25531 Pain in right wrist: Secondary | ICD-10-CM | POA: Diagnosis not present

## 2019-02-09 DIAGNOSIS — M25541 Pain in joints of right hand: Secondary | ICD-10-CM | POA: Diagnosis not present

## 2019-02-09 DIAGNOSIS — W01198S Fall on same level from slipping, tripping and stumbling with subsequent striking against other object, sequela: Secondary | ICD-10-CM | POA: Diagnosis not present

## 2019-02-09 DIAGNOSIS — M6281 Muscle weakness (generalized): Secondary | ICD-10-CM | POA: Diagnosis not present

## 2019-02-09 DIAGNOSIS — S52509S Unspecified fracture of the lower end of unspecified radius, sequela: Secondary | ICD-10-CM | POA: Diagnosis not present

## 2019-02-09 DIAGNOSIS — M25631 Stiffness of right wrist, not elsewhere classified: Secondary | ICD-10-CM | POA: Diagnosis not present

## 2019-02-09 DIAGNOSIS — M25641 Stiffness of right hand, not elsewhere classified: Secondary | ICD-10-CM | POA: Diagnosis not present

## 2019-02-14 DIAGNOSIS — M25631 Stiffness of right wrist, not elsewhere classified: Secondary | ICD-10-CM | POA: Diagnosis not present

## 2019-02-14 DIAGNOSIS — M25641 Stiffness of right hand, not elsewhere classified: Secondary | ICD-10-CM | POA: Diagnosis not present

## 2019-02-14 DIAGNOSIS — W01198S Fall on same level from slipping, tripping and stumbling with subsequent striking against other object, sequela: Secondary | ICD-10-CM | POA: Diagnosis not present

## 2019-02-14 DIAGNOSIS — S52509S Unspecified fracture of the lower end of unspecified radius, sequela: Secondary | ICD-10-CM | POA: Diagnosis not present

## 2019-02-14 DIAGNOSIS — M6281 Muscle weakness (generalized): Secondary | ICD-10-CM | POA: Diagnosis not present

## 2019-02-14 DIAGNOSIS — M25541 Pain in joints of right hand: Secondary | ICD-10-CM | POA: Diagnosis not present

## 2019-02-14 DIAGNOSIS — M25531 Pain in right wrist: Secondary | ICD-10-CM | POA: Diagnosis not present

## 2019-02-16 DIAGNOSIS — W01198S Fall on same level from slipping, tripping and stumbling with subsequent striking against other object, sequela: Secondary | ICD-10-CM | POA: Diagnosis not present

## 2019-02-16 DIAGNOSIS — M6281 Muscle weakness (generalized): Secondary | ICD-10-CM | POA: Diagnosis not present

## 2019-02-16 DIAGNOSIS — S52509S Unspecified fracture of the lower end of unspecified radius, sequela: Secondary | ICD-10-CM | POA: Diagnosis not present

## 2019-02-16 DIAGNOSIS — M25631 Stiffness of right wrist, not elsewhere classified: Secondary | ICD-10-CM | POA: Diagnosis not present

## 2019-02-16 DIAGNOSIS — M25641 Stiffness of right hand, not elsewhere classified: Secondary | ICD-10-CM | POA: Diagnosis not present

## 2019-02-16 DIAGNOSIS — M25531 Pain in right wrist: Secondary | ICD-10-CM | POA: Diagnosis not present

## 2019-02-16 DIAGNOSIS — M25541 Pain in joints of right hand: Secondary | ICD-10-CM | POA: Diagnosis not present

## 2019-02-21 DIAGNOSIS — W01198S Fall on same level from slipping, tripping and stumbling with subsequent striking against other object, sequela: Secondary | ICD-10-CM | POA: Diagnosis not present

## 2019-02-21 DIAGNOSIS — S52509S Unspecified fracture of the lower end of unspecified radius, sequela: Secondary | ICD-10-CM | POA: Diagnosis not present

## 2019-02-21 DIAGNOSIS — M6281 Muscle weakness (generalized): Secondary | ICD-10-CM | POA: Diagnosis not present

## 2019-02-21 DIAGNOSIS — M25631 Stiffness of right wrist, not elsewhere classified: Secondary | ICD-10-CM | POA: Diagnosis not present

## 2019-02-21 DIAGNOSIS — M25641 Stiffness of right hand, not elsewhere classified: Secondary | ICD-10-CM | POA: Diagnosis not present

## 2019-02-21 DIAGNOSIS — M25531 Pain in right wrist: Secondary | ICD-10-CM | POA: Diagnosis not present

## 2019-02-21 DIAGNOSIS — M25541 Pain in joints of right hand: Secondary | ICD-10-CM | POA: Diagnosis not present

## 2019-03-22 ENCOUNTER — Ambulatory Visit: Payer: Medicare Other | Admitting: Podiatry

## 2019-05-09 ENCOUNTER — Ambulatory Visit (INDEPENDENT_AMBULATORY_CARE_PROVIDER_SITE_OTHER): Payer: Medicare Other | Admitting: Podiatry

## 2019-05-09 ENCOUNTER — Encounter: Payer: Self-pay | Admitting: Podiatry

## 2019-05-09 ENCOUNTER — Other Ambulatory Visit: Payer: Self-pay

## 2019-05-09 DIAGNOSIS — M79675 Pain in left toe(s): Secondary | ICD-10-CM | POA: Diagnosis not present

## 2019-05-09 DIAGNOSIS — M79674 Pain in right toe(s): Secondary | ICD-10-CM

## 2019-05-09 DIAGNOSIS — E119 Type 2 diabetes mellitus without complications: Secondary | ICD-10-CM

## 2019-05-09 DIAGNOSIS — B351 Tinea unguium: Secondary | ICD-10-CM | POA: Diagnosis not present

## 2019-05-09 NOTE — Progress Notes (Signed)
Complaint:  Visit Type: Patient returns to my office for continued preventative foot care services. Complaint: Patient states" my nails have grown long and thick and become painful to walk and wear shoes" Patient has been diagnosed with DM with no foot complications. The patient presents for preventative foot care services. No changes to ROS  Podiatric Exam: Vascular: dorsalis pedis and posterior tibial pulses are palpable bilateral. Capillary return is immediate. Temperature gradient is WNL. Skin turgor WNL  Sensorium: Normal Semmes Weinstein monofilament test. Normal tactile sensation bilaterally. Nail Exam: Pt has thick disfigured discolored nails with subungual debris noted bilateral entire nail hallux through fifth toenails Ulcer Exam: There is no evidence of ulcer or pre-ulcerative changes or infection. Orthopedic Exam: Muscle tone and strength are WNL. No limitations in general ROM. No crepitus or effusions noted. Foot type and digits show no abnormalities. Bony prominences are unremarkable. Skin: No Porokeratosis. No infection or ulcers  Diagnosis:  Onychomycosis, , Pain in right toe, pain in left toes  Treatment & Plan Procedures and Treatment: Consent by patient was obtained for treatment procedures.   Debridement of mycotic and hypertrophic toenails, 1 through 5 bilateral and clearing of subungual debris. No ulceration, no infection noted.  Return Visit-Office Procedure: Patient instructed to return to the office for a follow up visit 3 months for continued evaluation and treatment.    Bayne Fosnaugh DPM 

## 2019-05-19 DIAGNOSIS — J449 Chronic obstructive pulmonary disease, unspecified: Secondary | ICD-10-CM | POA: Diagnosis not present

## 2019-05-19 DIAGNOSIS — Z7984 Long term (current) use of oral hypoglycemic drugs: Secondary | ICD-10-CM | POA: Diagnosis not present

## 2019-05-19 DIAGNOSIS — E1142 Type 2 diabetes mellitus with diabetic polyneuropathy: Secondary | ICD-10-CM | POA: Diagnosis not present

## 2019-05-19 DIAGNOSIS — Z1389 Encounter for screening for other disorder: Secondary | ICD-10-CM | POA: Diagnosis not present

## 2019-05-19 DIAGNOSIS — G4730 Sleep apnea, unspecified: Secondary | ICD-10-CM | POA: Diagnosis not present

## 2019-05-19 DIAGNOSIS — Z6835 Body mass index (BMI) 35.0-35.9, adult: Secondary | ICD-10-CM | POA: Diagnosis not present

## 2019-05-19 DIAGNOSIS — R1030 Lower abdominal pain, unspecified: Secondary | ICD-10-CM | POA: Diagnosis not present

## 2019-05-19 DIAGNOSIS — E7800 Pure hypercholesterolemia, unspecified: Secondary | ICD-10-CM | POA: Diagnosis not present

## 2019-05-30 ENCOUNTER — Other Ambulatory Visit: Payer: Self-pay | Admitting: Family Medicine

## 2019-05-31 ENCOUNTER — Other Ambulatory Visit: Payer: Self-pay | Admitting: Family Medicine

## 2019-05-31 DIAGNOSIS — R1030 Lower abdominal pain, unspecified: Secondary | ICD-10-CM

## 2019-06-07 ENCOUNTER — Other Ambulatory Visit: Payer: Self-pay | Admitting: Family Medicine

## 2019-06-07 DIAGNOSIS — R1084 Generalized abdominal pain: Secondary | ICD-10-CM

## 2019-06-08 DIAGNOSIS — M85852 Other specified disorders of bone density and structure, left thigh: Secondary | ICD-10-CM | POA: Diagnosis not present

## 2019-06-08 DIAGNOSIS — M85851 Other specified disorders of bone density and structure, right thigh: Secondary | ICD-10-CM | POA: Diagnosis not present

## 2019-06-08 DIAGNOSIS — Z1231 Encounter for screening mammogram for malignant neoplasm of breast: Secondary | ICD-10-CM | POA: Diagnosis not present

## 2019-06-09 ENCOUNTER — Ambulatory Visit
Admission: RE | Admit: 2019-06-09 | Discharge: 2019-06-09 | Disposition: A | Discharge status: Home / Self Care | Payer: Medicare Other | Source: Ambulatory Visit | Attending: Family Medicine | Admitting: Family Medicine

## 2019-06-09 DIAGNOSIS — R1084 Generalized abdominal pain: Secondary | ICD-10-CM

## 2019-07-20 DIAGNOSIS — R1314 Dysphagia, pharyngoesophageal phase: Secondary | ICD-10-CM | POA: Diagnosis not present

## 2019-07-20 DIAGNOSIS — R194 Change in bowel habit: Secondary | ICD-10-CM | POA: Diagnosis not present

## 2019-07-20 DIAGNOSIS — Z800 Family history of malignant neoplasm of digestive organs: Secondary | ICD-10-CM | POA: Diagnosis not present

## 2019-08-09 ENCOUNTER — Ambulatory Visit: Payer: Medicare Other | Admitting: Podiatry

## 2019-08-14 DIAGNOSIS — Z1159 Encounter for screening for other viral diseases: Secondary | ICD-10-CM | POA: Diagnosis not present

## 2019-08-17 DIAGNOSIS — R197 Diarrhea, unspecified: Secondary | ICD-10-CM | POA: Diagnosis not present

## 2019-08-17 DIAGNOSIS — K2930 Chronic superficial gastritis without bleeding: Secondary | ICD-10-CM | POA: Diagnosis not present

## 2019-08-17 DIAGNOSIS — K3189 Other diseases of stomach and duodenum: Secondary | ICD-10-CM | POA: Diagnosis not present

## 2019-08-17 DIAGNOSIS — R1310 Dysphagia, unspecified: Secondary | ICD-10-CM | POA: Diagnosis not present

## 2019-08-17 DIAGNOSIS — K5730 Diverticulosis of large intestine without perforation or abscess without bleeding: Secondary | ICD-10-CM | POA: Diagnosis not present

## 2019-08-17 DIAGNOSIS — Z800 Family history of malignant neoplasm of digestive organs: Secondary | ICD-10-CM | POA: Diagnosis not present

## 2019-08-17 DIAGNOSIS — K635 Polyp of colon: Secondary | ICD-10-CM | POA: Diagnosis not present

## 2019-08-17 DIAGNOSIS — K228 Other specified diseases of esophagus: Secondary | ICD-10-CM | POA: Diagnosis not present

## 2019-08-22 DIAGNOSIS — K635 Polyp of colon: Secondary | ICD-10-CM | POA: Diagnosis not present

## 2019-08-22 DIAGNOSIS — K2930 Chronic superficial gastritis without bleeding: Secondary | ICD-10-CM | POA: Diagnosis not present

## 2019-08-22 DIAGNOSIS — K228 Other specified diseases of esophagus: Secondary | ICD-10-CM | POA: Diagnosis not present

## 2019-08-31 ENCOUNTER — Other Ambulatory Visit (HOSPITAL_COMMUNITY): Payer: Self-pay | Admitting: Gastroenterology

## 2019-08-31 ENCOUNTER — Other Ambulatory Visit: Payer: Self-pay | Admitting: Gastroenterology

## 2019-08-31 DIAGNOSIS — R1310 Dysphagia, unspecified: Secondary | ICD-10-CM

## 2019-09-07 ENCOUNTER — Ambulatory Visit (HOSPITAL_COMMUNITY): Payer: Federal, State, Local not specified - PPO

## 2019-09-22 DIAGNOSIS — M1990 Unspecified osteoarthritis, unspecified site: Secondary | ICD-10-CM | POA: Diagnosis not present

## 2019-09-22 DIAGNOSIS — E7800 Pure hypercholesterolemia, unspecified: Secondary | ICD-10-CM | POA: Diagnosis not present

## 2019-09-22 DIAGNOSIS — E1142 Type 2 diabetes mellitus with diabetic polyneuropathy: Secondary | ICD-10-CM | POA: Diagnosis not present

## 2019-09-22 DIAGNOSIS — J4520 Mild intermittent asthma, uncomplicated: Secondary | ICD-10-CM | POA: Diagnosis not present

## 2019-09-22 DIAGNOSIS — H35033 Hypertensive retinopathy, bilateral: Secondary | ICD-10-CM | POA: Diagnosis not present

## 2019-09-22 DIAGNOSIS — E119 Type 2 diabetes mellitus without complications: Secondary | ICD-10-CM | POA: Diagnosis not present

## 2019-09-22 DIAGNOSIS — J449 Chronic obstructive pulmonary disease, unspecified: Secondary | ICD-10-CM | POA: Diagnosis not present

## 2019-09-22 DIAGNOSIS — E785 Hyperlipidemia, unspecified: Secondary | ICD-10-CM | POA: Diagnosis not present

## 2019-09-27 ENCOUNTER — Encounter (HOSPITAL_COMMUNITY): Admission: RE | Payer: Self-pay | Source: Home / Self Care

## 2019-09-27 ENCOUNTER — Ambulatory Visit (HOSPITAL_COMMUNITY): Admission: RE | Admit: 2019-09-27 | Payer: Medicare Other | Source: Home / Self Care | Admitting: Gastroenterology

## 2019-09-27 SURGERY — MANOMETRY, ESOPHAGUS

## 2019-10-13 ENCOUNTER — Ambulatory Visit (INDEPENDENT_AMBULATORY_CARE_PROVIDER_SITE_OTHER): Payer: Medicare Other | Admitting: Podiatry

## 2019-10-13 ENCOUNTER — Encounter: Payer: Self-pay | Admitting: Podiatry

## 2019-10-13 ENCOUNTER — Other Ambulatory Visit: Payer: Self-pay

## 2019-10-13 DIAGNOSIS — M79674 Pain in right toe(s): Secondary | ICD-10-CM

## 2019-10-13 DIAGNOSIS — M79675 Pain in left toe(s): Secondary | ICD-10-CM

## 2019-10-13 DIAGNOSIS — B351 Tinea unguium: Secondary | ICD-10-CM

## 2019-10-13 DIAGNOSIS — E119 Type 2 diabetes mellitus without complications: Secondary | ICD-10-CM | POA: Diagnosis not present

## 2019-10-13 NOTE — Progress Notes (Signed)
This patient returns to my office for at risk foot care.  This patient requires this care by a professional since this patient will be at risk due to having  Diabetes mellitus.  This patient is unable to cut nails herself since the patient cannot reach her nails.These nails are painful walking and wearing shoes.  This patient presents for at risk foot care today.  General Appearance  Alert, conversant and in no acute stress.  Vascular  Dorsalis pedis and posterior tibial  pulses are palpable  bilaterally.  Capillary return is within normal limits  bilaterally. Temperature is within normal limits  bilaterally.  Neurologic  Senn-Weinstein monofilament wire test within normal limits  bilaterally. Muscle power within normal limits bilaterally.  Nails Thick disfigured discolored nails with subungual debris  from hallux to fifth toes bilaterally. No evidence of bacterial infection or drainage bilaterally.  Orthopedic  No limitations of motion  feet .  No crepitus or effusions noted.  No bony pathology or digital deformities noted.  Skin  normotropic skin with no porokeratosis noted bilaterally.  No signs of infections or ulcers noted.     Onychomycosis  Pain in right toes  Pain in left toes  Consent was obtained for treatment procedures.   Mechanical debridement of nails 1-5  bilaterally performed with a nail nipper.  Filed with dremel without incident.    Return office visit     3 months                 Told patient to return for periodic foot care and evaluation due to potential at risk complications.   Penny Hernandez DPM  

## 2019-10-24 ENCOUNTER — Other Ambulatory Visit: Payer: Self-pay | Admitting: Physician Assistant

## 2019-10-24 DIAGNOSIS — M5416 Radiculopathy, lumbar region: Secondary | ICD-10-CM

## 2019-11-16 DIAGNOSIS — M85852 Other specified disorders of bone density and structure, left thigh: Secondary | ICD-10-CM | POA: Diagnosis not present

## 2019-11-16 DIAGNOSIS — G4730 Sleep apnea, unspecified: Secondary | ICD-10-CM | POA: Diagnosis not present

## 2019-11-16 DIAGNOSIS — Z0000 Encounter for general adult medical examination without abnormal findings: Secondary | ICD-10-CM | POA: Diagnosis not present

## 2019-11-16 DIAGNOSIS — K219 Gastro-esophageal reflux disease without esophagitis: Secondary | ICD-10-CM | POA: Diagnosis not present

## 2019-11-16 DIAGNOSIS — R350 Frequency of micturition: Secondary | ICD-10-CM | POA: Diagnosis not present

## 2019-11-16 DIAGNOSIS — E559 Vitamin D deficiency, unspecified: Secondary | ICD-10-CM | POA: Diagnosis not present

## 2019-11-16 DIAGNOSIS — J449 Chronic obstructive pulmonary disease, unspecified: Secondary | ICD-10-CM | POA: Diagnosis not present

## 2019-11-16 DIAGNOSIS — E1142 Type 2 diabetes mellitus with diabetic polyneuropathy: Secondary | ICD-10-CM | POA: Diagnosis not present

## 2019-11-16 DIAGNOSIS — G4733 Obstructive sleep apnea (adult) (pediatric): Secondary | ICD-10-CM | POA: Diagnosis not present

## 2019-11-16 DIAGNOSIS — E7800 Pure hypercholesterolemia, unspecified: Secondary | ICD-10-CM | POA: Diagnosis not present

## 2019-11-16 DIAGNOSIS — Z7984 Long term (current) use of oral hypoglycemic drugs: Secondary | ICD-10-CM | POA: Diagnosis not present

## 2020-01-09 ENCOUNTER — Ambulatory Visit (INDEPENDENT_AMBULATORY_CARE_PROVIDER_SITE_OTHER): Payer: Medicare Other | Admitting: Podiatry

## 2020-01-09 ENCOUNTER — Other Ambulatory Visit: Payer: Self-pay

## 2020-01-09 ENCOUNTER — Encounter: Payer: Self-pay | Admitting: Podiatry

## 2020-01-09 DIAGNOSIS — B351 Tinea unguium: Secondary | ICD-10-CM | POA: Diagnosis not present

## 2020-01-09 DIAGNOSIS — M79675 Pain in left toe(s): Secondary | ICD-10-CM | POA: Diagnosis not present

## 2020-01-09 DIAGNOSIS — M79674 Pain in right toe(s): Secondary | ICD-10-CM

## 2020-01-09 DIAGNOSIS — E119 Type 2 diabetes mellitus without complications: Secondary | ICD-10-CM

## 2020-01-09 NOTE — Progress Notes (Signed)
This patient returns to my office for at risk foot care.  This patient requires this care by a professional since this patient will be at risk due to having  Diabetes mellitus.  This patient is unable to cut nails herself since the patient cannot reach her nails.These nails are painful walking and wearing shoes.  This patient presents for at risk foot care today.  General Appearance  Alert, conversant and in no acute stress.  Vascular  Dorsalis pedis and posterior tibial  pulses are palpable  bilaterally.  Capillary return is within normal limits  bilaterally. Temperature is within normal limits  bilaterally.  Neurologic  Senn-Weinstein monofilament wire test within normal limits  bilaterally. Muscle power within normal limits bilaterally.  Nails Thick disfigured discolored nails with subungual debris  from hallux to fifth toes bilaterally. No evidence of bacterial infection or drainage bilaterally.  Orthopedic  No limitations of motion  feet .  No crepitus or effusions noted.  No bony pathology or digital deformities noted.  Skin  normotropic skin with no porokeratosis noted bilaterally.  No signs of infections or ulcers noted.     Onychomycosis  Pain in right toes  Pain in left toes  Consent was obtained for treatment procedures.   Mechanical debridement of nails 1-5  bilaterally performed with a nail nipper.  Filed with dremel without incident.    Return office visit     3 months                 Told patient to return for periodic foot care and evaluation due to potential at risk complications.   Gardiner Barefoot DPM

## 2020-01-16 DIAGNOSIS — R300 Dysuria: Secondary | ICD-10-CM | POA: Diagnosis not present

## 2020-01-16 DIAGNOSIS — R809 Proteinuria, unspecified: Secondary | ICD-10-CM | POA: Diagnosis not present

## 2020-01-24 DIAGNOSIS — L720 Epidermal cyst: Secondary | ICD-10-CM | POA: Diagnosis not present

## 2020-01-24 DIAGNOSIS — L853 Xerosis cutis: Secondary | ICD-10-CM | POA: Diagnosis not present

## 2020-01-24 DIAGNOSIS — Q800 Ichthyosis vulgaris: Secondary | ICD-10-CM | POA: Diagnosis not present

## 2020-01-31 DIAGNOSIS — Z01419 Encounter for gynecological examination (general) (routine) without abnormal findings: Secondary | ICD-10-CM | POA: Diagnosis not present

## 2020-01-31 DIAGNOSIS — N8110 Cystocele, unspecified: Secondary | ICD-10-CM | POA: Diagnosis not present

## 2020-02-22 DIAGNOSIS — H35033 Hypertensive retinopathy, bilateral: Secondary | ICD-10-CM | POA: Diagnosis not present

## 2020-02-22 DIAGNOSIS — J4520 Mild intermittent asthma, uncomplicated: Secondary | ICD-10-CM | POA: Diagnosis not present

## 2020-02-22 DIAGNOSIS — K219 Gastro-esophageal reflux disease without esophagitis: Secondary | ICD-10-CM | POA: Diagnosis not present

## 2020-02-22 DIAGNOSIS — E1142 Type 2 diabetes mellitus with diabetic polyneuropathy: Secondary | ICD-10-CM | POA: Diagnosis not present

## 2020-02-22 DIAGNOSIS — J449 Chronic obstructive pulmonary disease, unspecified: Secondary | ICD-10-CM | POA: Diagnosis not present

## 2020-02-22 DIAGNOSIS — E119 Type 2 diabetes mellitus without complications: Secondary | ICD-10-CM | POA: Diagnosis not present

## 2020-02-22 DIAGNOSIS — E7800 Pure hypercholesterolemia, unspecified: Secondary | ICD-10-CM | POA: Diagnosis not present

## 2020-02-22 DIAGNOSIS — E785 Hyperlipidemia, unspecified: Secondary | ICD-10-CM | POA: Diagnosis not present

## 2020-02-22 DIAGNOSIS — M1990 Unspecified osteoarthritis, unspecified site: Secondary | ICD-10-CM | POA: Diagnosis not present

## 2020-03-05 DIAGNOSIS — H35373 Puckering of macula, bilateral: Secondary | ICD-10-CM | POA: Diagnosis not present

## 2020-03-05 DIAGNOSIS — H524 Presbyopia: Secondary | ICD-10-CM | POA: Diagnosis not present

## 2020-03-05 DIAGNOSIS — H43813 Vitreous degeneration, bilateral: Secondary | ICD-10-CM | POA: Diagnosis not present

## 2020-03-05 DIAGNOSIS — H5213 Myopia, bilateral: Secondary | ICD-10-CM | POA: Diagnosis not present

## 2020-03-05 DIAGNOSIS — H52223 Regular astigmatism, bilateral: Secondary | ICD-10-CM | POA: Diagnosis not present

## 2020-03-05 DIAGNOSIS — E119 Type 2 diabetes mellitus without complications: Secondary | ICD-10-CM | POA: Diagnosis not present

## 2020-03-05 DIAGNOSIS — H35033 Hypertensive retinopathy, bilateral: Secondary | ICD-10-CM | POA: Diagnosis not present

## 2020-04-10 ENCOUNTER — Ambulatory Visit (INDEPENDENT_AMBULATORY_CARE_PROVIDER_SITE_OTHER): Payer: Medicare Other | Admitting: Podiatry

## 2020-04-10 ENCOUNTER — Other Ambulatory Visit: Payer: Self-pay

## 2020-04-10 ENCOUNTER — Encounter: Payer: Self-pay | Admitting: Podiatry

## 2020-04-10 DIAGNOSIS — M79674 Pain in right toe(s): Secondary | ICD-10-CM | POA: Diagnosis not present

## 2020-04-10 DIAGNOSIS — M129 Arthropathy, unspecified: Secondary | ICD-10-CM | POA: Diagnosis not present

## 2020-04-10 DIAGNOSIS — B351 Tinea unguium: Secondary | ICD-10-CM

## 2020-04-10 DIAGNOSIS — M79675 Pain in left toe(s): Secondary | ICD-10-CM | POA: Diagnosis not present

## 2020-04-10 DIAGNOSIS — E119 Type 2 diabetes mellitus without complications: Secondary | ICD-10-CM | POA: Diagnosis not present

## 2020-04-10 NOTE — Progress Notes (Signed)
This patient returns to my office for at risk foot care.  This patient requires this care by a professional since this patient will be at risk due to having  Diabetes mellitus.  This patient is unable to cut nails herself since the patient cannot reach her nails.These nails are painful walking and wearing shoes. She says she experiences pain through her feet after  Walking. This patient presents for at risk foot care today.  General Appearance  Alert, conversant and in no acute stress.  Vascular  Dorsalis pedis and posterior tibial  pulses are palpable  bilaterally.  Capillary return is within normal limits  bilaterally. Temperature is within normal limits  bilaterally.  Neurologic  Senn-Weinstein monofilament wire test within normal limits  bilaterally. Muscle power within normal limits bilaterally.  Nails Thick disfigured discolored nails with subungual debris  from hallux to fifth toes bilaterally. No evidence of bacterial infection or drainage bilaterally.  Orthopedic  No limitations of motion  feet .  No crepitus or effusions noted.  No bony pathology or digital deformities noted.  Midfoot arthritis  B/L  Skin  normotropic skin with no porokeratosis noted bilaterally.  No signs of infections or ulcers noted.     Onychomycosis  Pain in right toes  Pain in left toes  Chronic arthritis  B/L.  Consent was obtained for treatment procedures.   Mechanical debridement of nails 1-5  bilaterally performed with a nail nipper.  Filed with dremel without incident. Powerstep insoles were prescribed.   Return office visit     3 months                 Told patient to return for periodic foot care and evaluation due to potential at risk complications.   Helane Gunther DPM

## 2020-04-14 ENCOUNTER — Other Ambulatory Visit: Payer: Medicare Other

## 2020-04-14 DIAGNOSIS — Z20822 Contact with and (suspected) exposure to covid-19: Secondary | ICD-10-CM

## 2020-04-16 LAB — SARS-COV-2, NAA 2 DAY TAT

## 2020-04-16 LAB — NOVEL CORONAVIRUS, NAA: SARS-CoV-2, NAA: NOT DETECTED

## 2020-05-17 DIAGNOSIS — K219 Gastro-esophageal reflux disease without esophagitis: Secondary | ICD-10-CM | POA: Diagnosis not present

## 2020-05-17 DIAGNOSIS — J4520 Mild intermittent asthma, uncomplicated: Secondary | ICD-10-CM | POA: Diagnosis not present

## 2020-05-17 DIAGNOSIS — E7800 Pure hypercholesterolemia, unspecified: Secondary | ICD-10-CM | POA: Diagnosis not present

## 2020-05-17 DIAGNOSIS — E1142 Type 2 diabetes mellitus with diabetic polyneuropathy: Secondary | ICD-10-CM | POA: Diagnosis not present

## 2020-05-17 DIAGNOSIS — G4733 Obstructive sleep apnea (adult) (pediatric): Secondary | ICD-10-CM | POA: Diagnosis not present

## 2020-05-17 DIAGNOSIS — R1031 Right lower quadrant pain: Secondary | ICD-10-CM | POA: Diagnosis not present

## 2020-05-17 DIAGNOSIS — J449 Chronic obstructive pulmonary disease, unspecified: Secondary | ICD-10-CM | POA: Diagnosis not present

## 2020-05-17 DIAGNOSIS — Z0000 Encounter for general adult medical examination without abnormal findings: Secondary | ICD-10-CM | POA: Diagnosis not present

## 2020-06-26 ENCOUNTER — Other Ambulatory Visit: Payer: Self-pay

## 2020-06-26 ENCOUNTER — Ambulatory Visit (INDEPENDENT_AMBULATORY_CARE_PROVIDER_SITE_OTHER): Payer: Medicare Other | Admitting: Podiatry

## 2020-06-26 ENCOUNTER — Encounter: Payer: Self-pay | Admitting: Podiatry

## 2020-06-26 DIAGNOSIS — M129 Arthropathy, unspecified: Secondary | ICD-10-CM | POA: Diagnosis not present

## 2020-06-26 DIAGNOSIS — M79675 Pain in left toe(s): Secondary | ICD-10-CM

## 2020-06-26 DIAGNOSIS — M79674 Pain in right toe(s): Secondary | ICD-10-CM | POA: Diagnosis not present

## 2020-06-26 DIAGNOSIS — B351 Tinea unguium: Secondary | ICD-10-CM

## 2020-06-26 DIAGNOSIS — E119 Type 2 diabetes mellitus without complications: Secondary | ICD-10-CM

## 2020-06-26 NOTE — Progress Notes (Signed)
This patient returns to my office for at risk foot care.  This patient requires this care by a professional since this patient will be at risk due to having  Diabetes mellitus.  This patient is unable to cut nails herself since the patient cannot reach her nails.These nails are painful walking and wearing shoes. She says she experiences pain through her feet after  Walking. This patient presents for at risk foot care today.  General Appearance  Alert, conversant and in no acute stress.  Vascular  Dorsalis pedis and posterior tibial  pulses are palpable  bilaterally.  Capillary return is within normal limits  bilaterally. Temperature is within normal limits  bilaterally.  Neurologic  Senn-Weinstein monofilament wire test within normal limits  bilaterally. Muscle power within normal limits bilaterally.  Nails Thick disfigured discolored nails with subungual debris  from hallux to fifth toes bilaterally. No evidence of bacterial infection or drainage bilaterally.  Orthopedic  No limitations of motion  feet .  No crepitus or effusions noted.  No bony pathology or digital deformities noted.  Midfoot arthritis  B/L  Skin  normotropic skin with no porokeratosis noted bilaterally.  No signs of infections or ulcers noted.     Onychomycosis  Pain in right toes  Pain in left toes  Chronic arthritis  B/L.  Consent was obtained for treatment procedures.   Mechanical debridement of nails 1-5  bilaterally performed with a nail nipper.  Filed with dremel without incident.    Return office visit     3 months                 Told patient to return for periodic foot care and evaluation due to potential at risk complications.   Gardiner Barefoot DPM

## 2020-07-02 ENCOUNTER — Ambulatory Visit: Payer: Medicare Other | Admitting: Podiatry

## 2020-07-06 IMAGING — US US ABDOMEN COMPLETE
1 series · 14 of 25 positions shown · non-contrast
Comparison: 2728

CLINICAL DATA: Abdominal pain

EXAM:
ABDOMEN ULTRASOUND COMPLETE

[Series 1: us abdomen complete · 0.20mm/px · 14 of 75 slices shown]
[im 1/75]
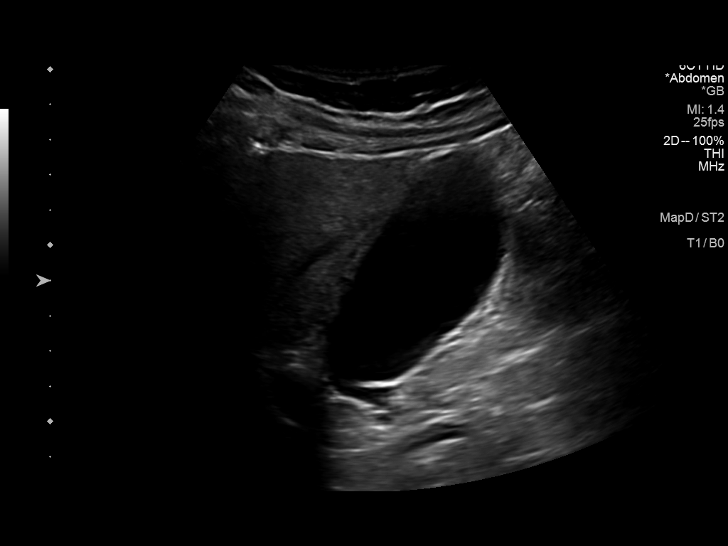
[im 7/75]
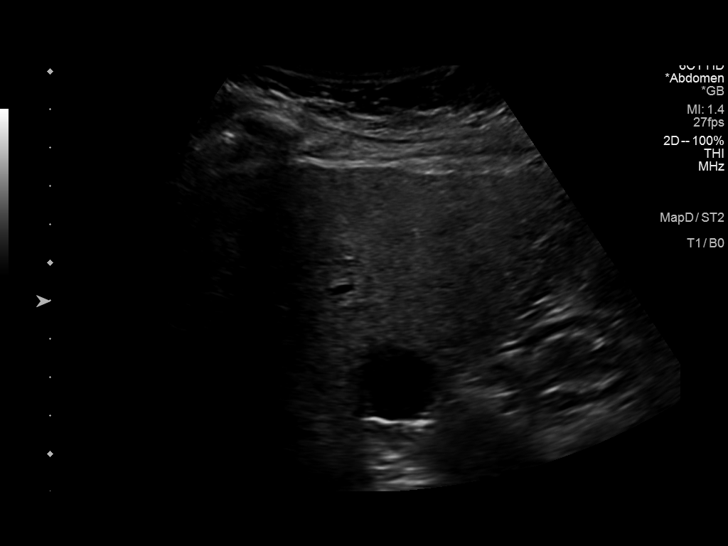
[im 13/75]
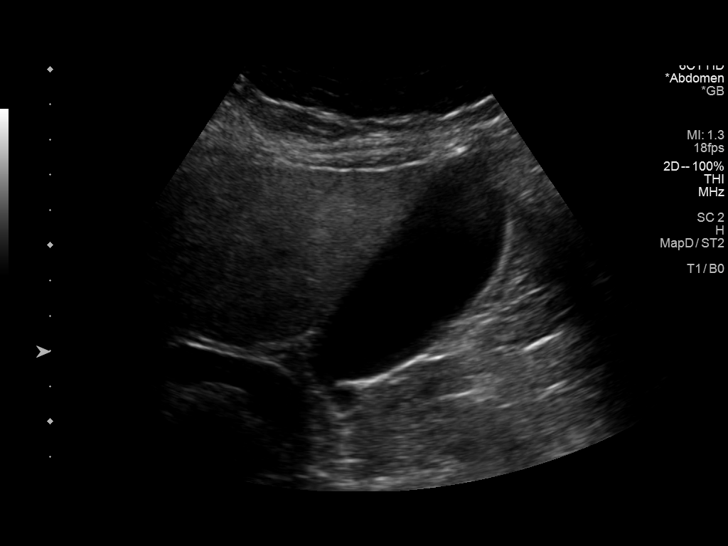
[im 19/75]
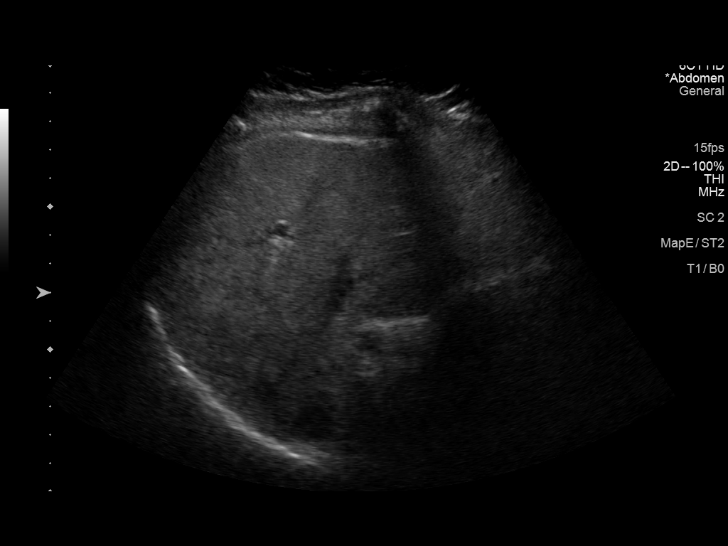
[im 25/75]
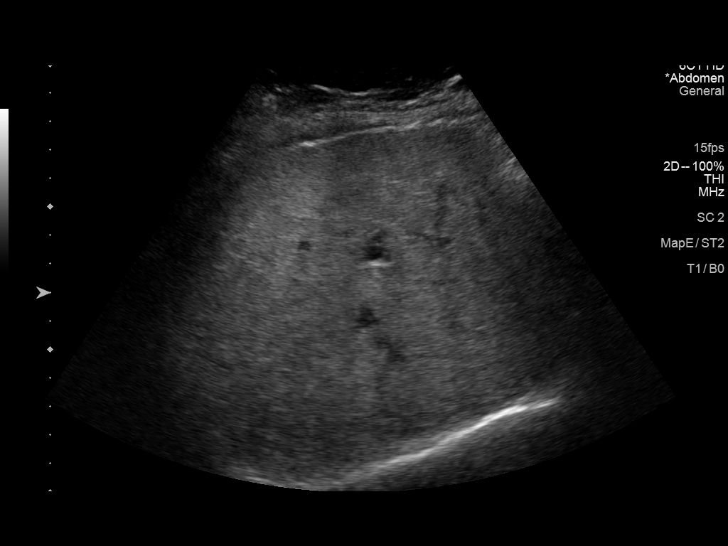
[im 28/75]
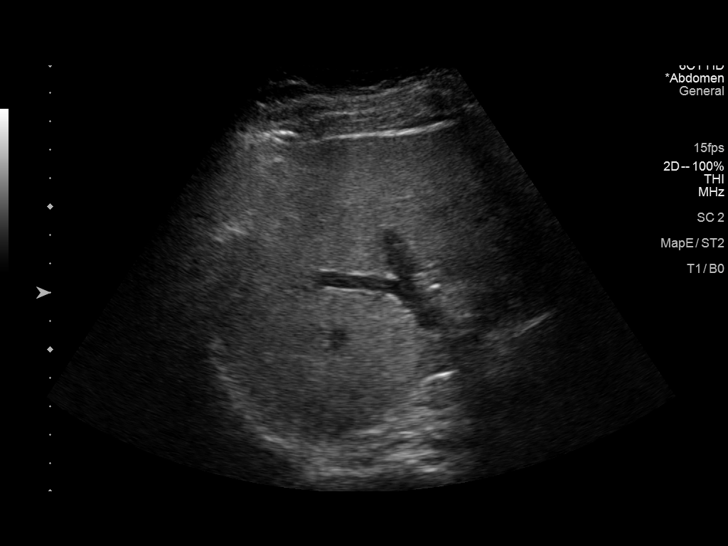
[im 34/75]
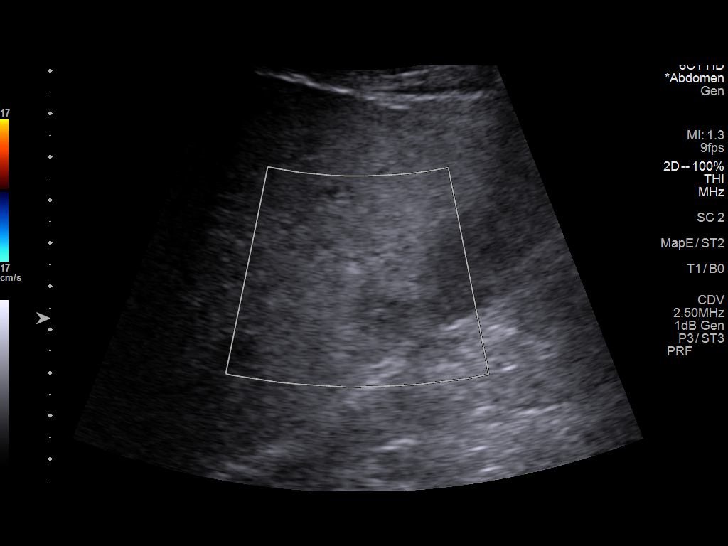
[im 41/75]
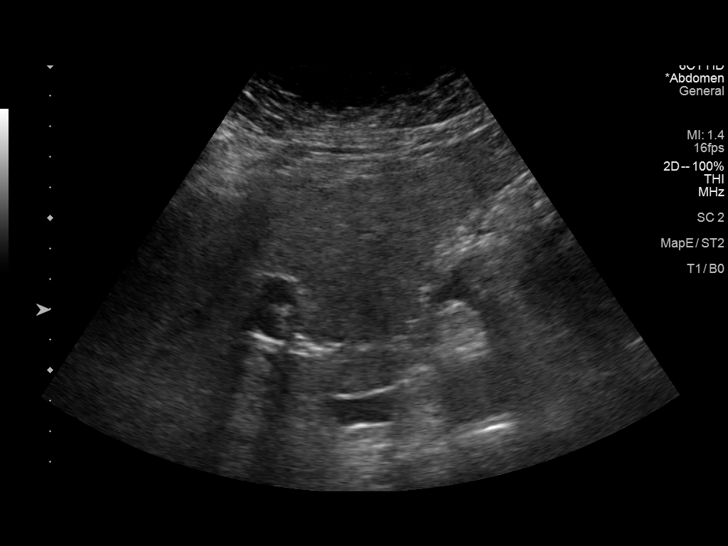
[im 47/75]
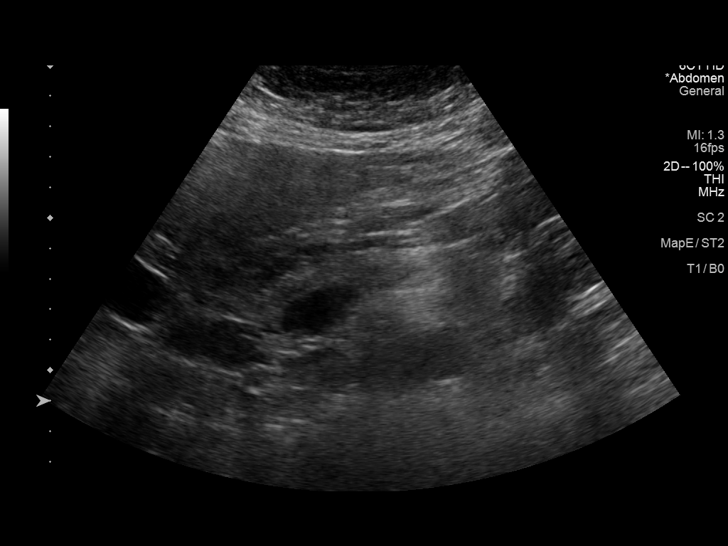
[im 50/75]
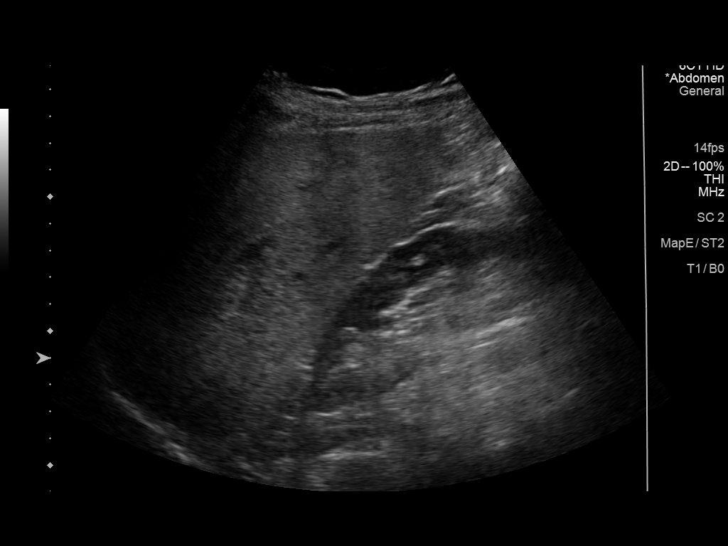
[im 56/75]
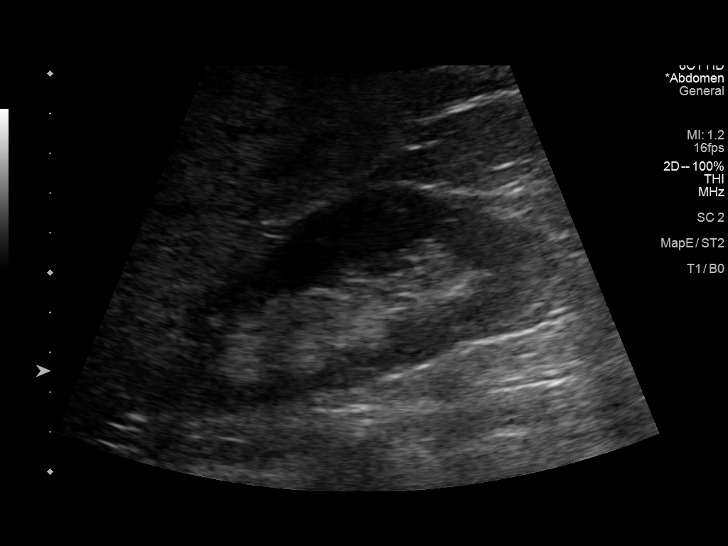
[im 62/75]
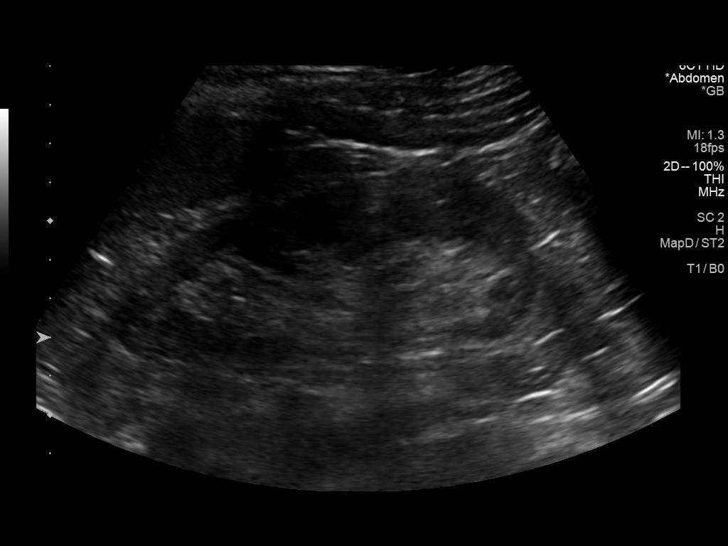
[im 68/75]
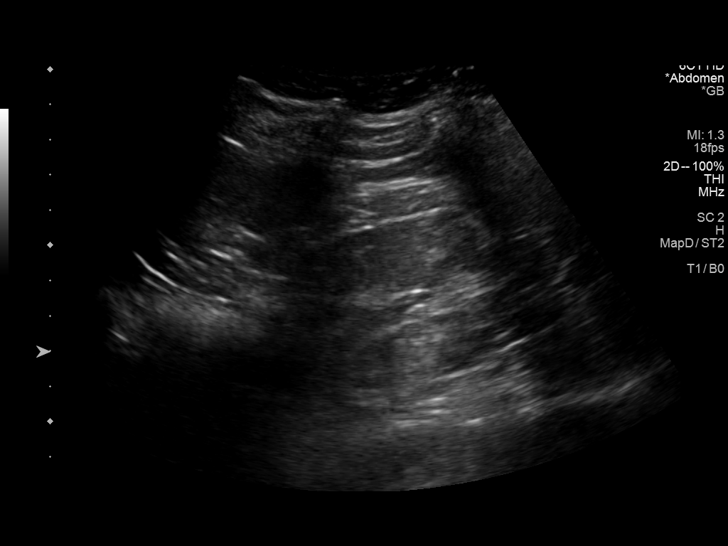
[im 75/75]
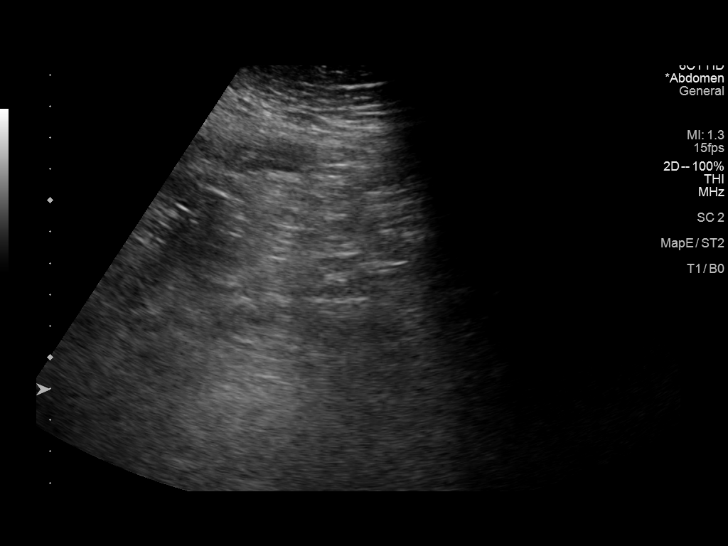

[14 of 25 positions shown; findings below may reference images not displayed]

FINDINGS: Gallbladder: No gallstones or wall thickening visualized. No
sonographic Murphy sign noted by sonographer.

Common bile duct: Diameter: 3 mm, normal.

Liver: Mildly heterogeneous. There is a small hypoechoic area
adjacent to the gallbladder measuring up to 1.5 cm. Increased
parenchymal echogenicity. Portal vein is patent on color Doppler
imaging with normal direction of blood flow towards the liver.

IVC: No abnormality visualized.

Pancreas: Visualized portion unremarkable.

Spleen: Size and appearance within normal limits.

Right Kidney: Length: 10.9 cm. Echogenicity within normal limits. No
mass or hydronephrosis visualized.

Left Kidney: Length: 11.8 cm. Echogenicity within normal limits. No
mass or hydronephrosis visualized.

Abdominal aorta: No aneurysm visualized.

Other findings: None.
IMPRESSION: Hepatic steatosis. A small hypoechoic area adjacent to the
gallbladder likely reflects focal fatty sparing.

Otherwise unremarkable study.

## 2020-08-22 DIAGNOSIS — Z1231 Encounter for screening mammogram for malignant neoplasm of breast: Secondary | ICD-10-CM | POA: Diagnosis not present

## 2020-09-25 ENCOUNTER — Ambulatory Visit (INDEPENDENT_AMBULATORY_CARE_PROVIDER_SITE_OTHER): Payer: Medicare Other | Admitting: Podiatry

## 2020-09-25 ENCOUNTER — Encounter: Payer: Self-pay | Admitting: Podiatry

## 2020-09-25 ENCOUNTER — Other Ambulatory Visit: Payer: Self-pay

## 2020-09-25 DIAGNOSIS — G4733 Obstructive sleep apnea (adult) (pediatric): Secondary | ICD-10-CM | POA: Insufficient documentation

## 2020-09-25 DIAGNOSIS — Z8371 Family history of colonic polyps: Secondary | ICD-10-CM | POA: Insufficient documentation

## 2020-09-25 DIAGNOSIS — M5430 Sciatica, unspecified side: Secondary | ICD-10-CM | POA: Insufficient documentation

## 2020-09-25 DIAGNOSIS — H8110 Benign paroxysmal vertigo, unspecified ear: Secondary | ICD-10-CM | POA: Insufficient documentation

## 2020-09-25 DIAGNOSIS — N3281 Overactive bladder: Secondary | ICD-10-CM | POA: Insufficient documentation

## 2020-09-25 DIAGNOSIS — E1142 Type 2 diabetes mellitus with diabetic polyneuropathy: Secondary | ICD-10-CM | POA: Insufficient documentation

## 2020-09-25 DIAGNOSIS — M79674 Pain in right toe(s): Secondary | ICD-10-CM | POA: Diagnosis not present

## 2020-09-25 DIAGNOSIS — J449 Chronic obstructive pulmonary disease, unspecified: Secondary | ICD-10-CM | POA: Insufficient documentation

## 2020-09-25 DIAGNOSIS — R1310 Dysphagia, unspecified: Secondary | ICD-10-CM | POA: Insufficient documentation

## 2020-09-25 DIAGNOSIS — J4520 Mild intermittent asthma, uncomplicated: Secondary | ICD-10-CM | POA: Insufficient documentation

## 2020-09-25 DIAGNOSIS — Z83719 Family history of colon polyps, unspecified: Secondary | ICD-10-CM | POA: Insufficient documentation

## 2020-09-25 DIAGNOSIS — E559 Vitamin D deficiency, unspecified: Secondary | ICD-10-CM | POA: Insufficient documentation

## 2020-09-25 DIAGNOSIS — H35039 Hypertensive retinopathy, unspecified eye: Secondary | ICD-10-CM | POA: Insufficient documentation

## 2020-09-25 DIAGNOSIS — E119 Type 2 diabetes mellitus without complications: Secondary | ICD-10-CM | POA: Diagnosis not present

## 2020-09-25 DIAGNOSIS — K219 Gastro-esophageal reflux disease without esophagitis: Secondary | ICD-10-CM | POA: Insufficient documentation

## 2020-09-25 DIAGNOSIS — B351 Tinea unguium: Secondary | ICD-10-CM | POA: Diagnosis not present

## 2020-09-25 DIAGNOSIS — J309 Allergic rhinitis, unspecified: Secondary | ICD-10-CM | POA: Insufficient documentation

## 2020-09-25 DIAGNOSIS — M79675 Pain in left toe(s): Secondary | ICD-10-CM

## 2020-09-25 NOTE — Progress Notes (Signed)
This patient returns to my office for at risk foot care.  This patient requires this care by a professional since this patient will be at risk due to having  Diabetes mellitus.  This patient is unable to cut nails herself since the patient cannot reach her nails.These nails are painful walking and wearing shoes.  This patient presents for at risk foot care today.  General Appearance  Alert, conversant and in no acute stress.  Vascular  Dorsalis pedis and posterior tibial  pulses are palpable  bilaterally.  Capillary return is within normal limits  bilaterally. Temperature is within normal limits  bilaterally.  Neurologic  Senn-Weinstein monofilament wire test within normal limits  bilaterally. Muscle power within normal limits bilaterally.  Nails Thick disfigured discolored nails with subungual debris  from hallux to fifth toes bilaterally. No evidence of bacterial infection or drainage bilaterally.  Orthopedic  No limitations of motion  feet .  No crepitus or effusions noted.  No bony pathology or digital deformities noted.  Midfoot arthritis  B/L  Skin  normotropic skin with no porokeratosis noted bilaterally.  No signs of infections or ulcers noted.     Onychomycosis  Pain in right toes  Pain in left toes  Chronic arthritis  B/L.  Consent was obtained for treatment procedures.   Mechanical debridement of nails 1-5  bilaterally performed with a nail nipper.  Filed with dremel without incident.    Return office visit     3 months                 Told patient to return for periodic foot care and evaluation due to potential at risk complications.   Toshi Ishii DPM  

## 2020-10-08 DIAGNOSIS — Z20822 Contact with and (suspected) exposure to covid-19: Secondary | ICD-10-CM | POA: Diagnosis not present

## 2020-11-29 DIAGNOSIS — K219 Gastro-esophageal reflux disease without esophagitis: Secondary | ICD-10-CM | POA: Diagnosis not present

## 2020-11-29 DIAGNOSIS — R350 Frequency of micturition: Secondary | ICD-10-CM | POA: Diagnosis not present

## 2020-11-29 DIAGNOSIS — G4733 Obstructive sleep apnea (adult) (pediatric): Secondary | ICD-10-CM | POA: Diagnosis not present

## 2020-11-29 DIAGNOSIS — E559 Vitamin D deficiency, unspecified: Secondary | ICD-10-CM | POA: Diagnosis not present

## 2020-11-29 DIAGNOSIS — J4520 Mild intermittent asthma, uncomplicated: Secondary | ICD-10-CM | POA: Diagnosis not present

## 2020-11-29 DIAGNOSIS — E1142 Type 2 diabetes mellitus with diabetic polyneuropathy: Secondary | ICD-10-CM | POA: Diagnosis not present

## 2020-11-29 DIAGNOSIS — M25512 Pain in left shoulder: Secondary | ICD-10-CM | POA: Diagnosis not present

## 2020-11-29 DIAGNOSIS — E7800 Pure hypercholesterolemia, unspecified: Secondary | ICD-10-CM | POA: Diagnosis not present

## 2020-12-03 DIAGNOSIS — J4520 Mild intermittent asthma, uncomplicated: Secondary | ICD-10-CM | POA: Diagnosis not present

## 2020-12-03 DIAGNOSIS — H35033 Hypertensive retinopathy, bilateral: Secondary | ICD-10-CM | POA: Diagnosis not present

## 2020-12-03 DIAGNOSIS — E1142 Type 2 diabetes mellitus with diabetic polyneuropathy: Secondary | ICD-10-CM | POA: Diagnosis not present

## 2020-12-03 DIAGNOSIS — M1990 Unspecified osteoarthritis, unspecified site: Secondary | ICD-10-CM | POA: Diagnosis not present

## 2020-12-03 DIAGNOSIS — E7800 Pure hypercholesterolemia, unspecified: Secondary | ICD-10-CM | POA: Diagnosis not present

## 2020-12-03 DIAGNOSIS — K219 Gastro-esophageal reflux disease without esophagitis: Secondary | ICD-10-CM | POA: Diagnosis not present

## 2020-12-03 DIAGNOSIS — J449 Chronic obstructive pulmonary disease, unspecified: Secondary | ICD-10-CM | POA: Diagnosis not present

## 2020-12-03 DIAGNOSIS — E119 Type 2 diabetes mellitus without complications: Secondary | ICD-10-CM | POA: Diagnosis not present

## 2020-12-03 DIAGNOSIS — E785 Hyperlipidemia, unspecified: Secondary | ICD-10-CM | POA: Diagnosis not present

## 2020-12-18 DIAGNOSIS — M25512 Pain in left shoulder: Secondary | ICD-10-CM | POA: Diagnosis not present

## 2020-12-20 ENCOUNTER — Other Ambulatory Visit: Payer: Self-pay | Admitting: Sports Medicine

## 2020-12-20 ENCOUNTER — Ambulatory Visit
Admission: RE | Admit: 2020-12-20 | Discharge: 2020-12-20 | Disposition: A | Discharge status: Home / Self Care | Payer: Medicare Other | Source: Ambulatory Visit | Attending: Sports Medicine | Admitting: Sports Medicine

## 2020-12-20 DIAGNOSIS — M19012 Primary osteoarthritis, left shoulder: Secondary | ICD-10-CM | POA: Diagnosis not present

## 2020-12-20 DIAGNOSIS — M25512 Pain in left shoulder: Secondary | ICD-10-CM

## 2021-01-01 ENCOUNTER — Encounter: Payer: Self-pay | Admitting: Podiatry

## 2021-01-01 ENCOUNTER — Ambulatory Visit (INDEPENDENT_AMBULATORY_CARE_PROVIDER_SITE_OTHER): Payer: Medicare Other | Admitting: Podiatry

## 2021-01-01 ENCOUNTER — Other Ambulatory Visit: Payer: Self-pay

## 2021-01-01 DIAGNOSIS — M79675 Pain in left toe(s): Secondary | ICD-10-CM

## 2021-01-01 DIAGNOSIS — B351 Tinea unguium: Secondary | ICD-10-CM

## 2021-01-01 DIAGNOSIS — M79674 Pain in right toe(s): Secondary | ICD-10-CM | POA: Diagnosis not present

## 2021-01-01 DIAGNOSIS — E119 Type 2 diabetes mellitus without complications: Secondary | ICD-10-CM | POA: Diagnosis not present

## 2021-01-01 NOTE — Progress Notes (Signed)
This patient returns to my office for at risk foot care.  This patient requires this care by a professional since this patient will be at risk due to having  Diabetes mellitus.  This patient is unable to cut nails herself since the patient cannot reach her nails.These nails are painful walking and wearing shoes.  This patient presents for at risk foot care today.  General Appearance  Alert, conversant and in no acute stress.  Vascular  Dorsalis pedis and posterior tibial  pulses are palpable  bilaterally.  Capillary return is within normal limits  bilaterally. Temperature is within normal limits  bilaterally.  Neurologic  Senn-Weinstein monofilament wire test within normal limits  bilaterally. Muscle power within normal limits bilaterally.  Nails Thick disfigured discolored nails with subungual debris  from hallux to fifth toes bilaterally. No evidence of bacterial infection or drainage bilaterally.  Orthopedic  No limitations of motion  feet .  No crepitus or effusions noted.  No bony pathology or digital deformities noted.  Midfoot arthritis  B/L  Skin  normotropic skin with no porokeratosis noted bilaterally.  No signs of infections or ulcers noted.     Onychomycosis  Pain in right toes  Pain in left toes  Chronic arthritis  B/L.  Consent was obtained for treatment procedures.   Mechanical debridement of nails 1-5  bilaterally performed with a nail nipper.  Filed with dremel without incident.    Return office visit     3 months                 Told patient to return for periodic foot care and evaluation due to potential at risk complications.   Linetta Regner DPM  

## 2021-01-09 DIAGNOSIS — M25512 Pain in left shoulder: Secondary | ICD-10-CM | POA: Diagnosis not present

## 2021-01-15 DIAGNOSIS — M25512 Pain in left shoulder: Secondary | ICD-10-CM | POA: Diagnosis not present

## 2021-01-27 DIAGNOSIS — M1990 Unspecified osteoarthritis, unspecified site: Secondary | ICD-10-CM | POA: Diagnosis not present

## 2021-01-27 DIAGNOSIS — E1142 Type 2 diabetes mellitus with diabetic polyneuropathy: Secondary | ICD-10-CM | POA: Diagnosis not present

## 2021-01-27 DIAGNOSIS — K219 Gastro-esophageal reflux disease without esophagitis: Secondary | ICD-10-CM | POA: Diagnosis not present

## 2021-01-27 DIAGNOSIS — H35033 Hypertensive retinopathy, bilateral: Secondary | ICD-10-CM | POA: Diagnosis not present

## 2021-01-27 DIAGNOSIS — E7800 Pure hypercholesterolemia, unspecified: Secondary | ICD-10-CM | POA: Diagnosis not present

## 2021-01-27 DIAGNOSIS — J4520 Mild intermittent asthma, uncomplicated: Secondary | ICD-10-CM | POA: Diagnosis not present

## 2021-01-27 DIAGNOSIS — J449 Chronic obstructive pulmonary disease, unspecified: Secondary | ICD-10-CM | POA: Diagnosis not present

## 2021-01-27 DIAGNOSIS — E119 Type 2 diabetes mellitus without complications: Secondary | ICD-10-CM | POA: Diagnosis not present

## 2021-01-27 DIAGNOSIS — E785 Hyperlipidemia, unspecified: Secondary | ICD-10-CM | POA: Diagnosis not present

## 2021-02-10 ENCOUNTER — Encounter (HOSPITAL_COMMUNITY): Payer: Self-pay | Admitting: Family Medicine

## 2021-02-10 ENCOUNTER — Observation Stay (HOSPITAL_COMMUNITY)
Admission: EM | Admit: 2021-02-10 | Discharge: 2021-02-11 | Disposition: A | Discharge status: Home / Self Care | Payer: Medicare Other | Attending: Internal Medicine | Admitting: Internal Medicine

## 2021-02-10 ENCOUNTER — Other Ambulatory Visit: Payer: Self-pay

## 2021-02-10 ENCOUNTER — Emergency Department (HOSPITAL_COMMUNITY): Payer: Medicare Other

## 2021-02-10 ENCOUNTER — Inpatient Hospital Stay (HOSPITAL_COMMUNITY): Payer: Medicare Other

## 2021-02-10 DIAGNOSIS — E1140 Type 2 diabetes mellitus with diabetic neuropathy, unspecified: Secondary | ICD-10-CM

## 2021-02-10 DIAGNOSIS — J101 Influenza due to other identified influenza virus with other respiratory manifestations: Secondary | ICD-10-CM | POA: Diagnosis present

## 2021-02-10 DIAGNOSIS — I959 Hypotension, unspecified: Secondary | ICD-10-CM | POA: Diagnosis not present

## 2021-02-10 DIAGNOSIS — R7989 Other specified abnormal findings of blood chemistry: Secondary | ICD-10-CM | POA: Diagnosis not present

## 2021-02-10 DIAGNOSIS — N289 Disorder of kidney and ureter, unspecified: Secondary | ICD-10-CM

## 2021-02-10 DIAGNOSIS — Z7984 Long term (current) use of oral hypoglycemic drugs: Secondary | ICD-10-CM | POA: Insufficient documentation

## 2021-02-10 DIAGNOSIS — K7689 Other specified diseases of liver: Secondary | ICD-10-CM | POA: Diagnosis not present

## 2021-02-10 DIAGNOSIS — J09X9 Influenza due to identified novel influenza A virus with other manifestations: Secondary | ICD-10-CM | POA: Diagnosis not present

## 2021-02-10 DIAGNOSIS — R000 Tachycardia, unspecified: Secondary | ICD-10-CM

## 2021-02-10 DIAGNOSIS — R531 Weakness: Secondary | ICD-10-CM

## 2021-02-10 DIAGNOSIS — Z0389 Encounter for observation for other suspected diseases and conditions ruled out: Secondary | ICD-10-CM | POA: Diagnosis not present

## 2021-02-10 DIAGNOSIS — J4520 Mild intermittent asthma, uncomplicated: Secondary | ICD-10-CM | POA: Diagnosis not present

## 2021-02-10 DIAGNOSIS — N189 Chronic kidney disease, unspecified: Secondary | ICD-10-CM | POA: Diagnosis not present

## 2021-02-10 DIAGNOSIS — Z20822 Contact with and (suspected) exposure to covid-19: Secondary | ICD-10-CM | POA: Diagnosis not present

## 2021-02-10 DIAGNOSIS — J45909 Unspecified asthma, uncomplicated: Secondary | ICD-10-CM | POA: Diagnosis not present

## 2021-02-10 DIAGNOSIS — R059 Cough, unspecified: Secondary | ICD-10-CM | POA: Diagnosis not present

## 2021-02-10 DIAGNOSIS — E1149 Type 2 diabetes mellitus with other diabetic neurological complication: Secondary | ICD-10-CM | POA: Diagnosis present

## 2021-02-10 DIAGNOSIS — Z79899 Other long term (current) drug therapy: Secondary | ICD-10-CM | POA: Insufficient documentation

## 2021-02-10 DIAGNOSIS — E1122 Type 2 diabetes mellitus with diabetic chronic kidney disease: Secondary | ICD-10-CM | POA: Insufficient documentation

## 2021-02-10 DIAGNOSIS — R509 Fever, unspecified: Secondary | ICD-10-CM | POA: Diagnosis not present

## 2021-02-10 DIAGNOSIS — J111 Influenza due to unidentified influenza virus with other respiratory manifestations: Secondary | ICD-10-CM

## 2021-02-10 LAB — CBC WITH DIFFERENTIAL/PLATELET
Abs Immature Granulocytes: 0.05 10*3/uL (ref 0.00–0.07)
Basophils Absolute: 0 10*3/uL (ref 0.0–0.1)
Basophils Relative: 0 %
Eosinophils Absolute: 0 10*3/uL (ref 0.0–0.5)
Eosinophils Relative: 0 %
HCT: 41.1 % (ref 36.0–46.0)
Hemoglobin: 12.8 g/dL (ref 12.0–15.0)
Immature Granulocytes: 1 %
Lymphocytes Relative: 6 %
Lymphs Abs: 0.5 10*3/uL — ABNORMAL LOW (ref 0.7–4.0)
MCH: 30.2 pg (ref 26.0–34.0)
MCHC: 31.1 g/dL (ref 30.0–36.0)
MCV: 96.9 fL (ref 80.0–100.0)
Monocytes Absolute: 0.4 10*3/uL (ref 0.1–1.0)
Monocytes Relative: 5 %
Neutro Abs: 7.3 10*3/uL (ref 1.7–7.7)
Neutrophils Relative %: 88 %
Platelets: 213 10*3/uL (ref 150–400)
RBC: 4.24 MIL/uL (ref 3.87–5.11)
RDW: 12.4 % (ref 11.5–15.5)
WBC: 8.2 10*3/uL (ref 4.0–10.5)
nRBC: 0 % (ref 0.0–0.2)

## 2021-02-10 LAB — URINALYSIS, ROUTINE W REFLEX MICROSCOPIC
Bilirubin Urine: NEGATIVE
Glucose, UA: 50 mg/dL — AB
Hgb urine dipstick: NEGATIVE
Ketones, ur: NEGATIVE mg/dL
Leukocytes,Ua: NEGATIVE
Nitrite: NEGATIVE
Protein, ur: 30 mg/dL — AB
Specific Gravity, Urine: 1.009 (ref 1.005–1.030)
pH: 7 (ref 5.0–8.0)

## 2021-02-10 LAB — COMPREHENSIVE METABOLIC PANEL
ALT: 73 U/L — ABNORMAL HIGH (ref 0–44)
AST: 65 U/L — ABNORMAL HIGH (ref 15–41)
Albumin: 3.5 g/dL (ref 3.5–5.0)
Alkaline Phosphatase: 144 U/L — ABNORMAL HIGH (ref 38–126)
Anion gap: 9 (ref 5–15)
BUN: 14 mg/dL (ref 8–23)
CO2: 23 mmol/L (ref 22–32)
Calcium: 9.3 mg/dL (ref 8.9–10.3)
Chloride: 105 mmol/L (ref 98–111)
Creatinine, Ser: 1.13 mg/dL — ABNORMAL HIGH (ref 0.44–1.00)
GFR, Estimated: 49 mL/min — ABNORMAL LOW (ref >60)
Glucose, Bld: 186 mg/dL — ABNORMAL HIGH (ref 70–99)
Potassium: 3.9 mmol/L (ref 3.5–5.1)
Sodium: 137 mmol/L (ref 135–145)
Total Bilirubin: 1.6 mg/dL — ABNORMAL HIGH (ref 0.3–1.2)
Total Protein: 7.3 g/dL (ref 6.5–8.1)

## 2021-02-10 LAB — CK: Total CK: 448 U/L — ABNORMAL HIGH (ref 38–234)

## 2021-02-10 LAB — GLUCOSE, CAPILLARY: Glucose-Capillary: 122 mg/dL — ABNORMAL HIGH (ref 70–99)

## 2021-02-10 LAB — CBG MONITORING, ED
Glucose-Capillary: 136 mg/dL — ABNORMAL HIGH (ref 70–99)
Glucose-Capillary: 161 mg/dL — ABNORMAL HIGH (ref 70–99)
Glucose-Capillary: 125 mg/dL — ABNORMAL HIGH (ref 70–99)

## 2021-02-10 LAB — RESP PANEL BY RT-PCR (FLU A&B, COVID) ARPGX2
Influenza A by PCR: POSITIVE — AB
Influenza B by PCR: NEGATIVE
SARS Coronavirus 2 by RT PCR: NEGATIVE

## 2021-02-10 LAB — URINE CULTURE

## 2021-02-10 LAB — PROTIME-INR
INR: 1.1 (ref 0.8–1.2)
Prothrombin Time: 14.1 seconds (ref 11.4–15.2)

## 2021-02-10 LAB — LACTIC ACID, PLASMA
Lactic Acid, Venous: 2.4 mmol/L (ref 0.5–1.9)
Lactic Acid, Venous: 1.1 mmol/L (ref 0.5–1.9)

## 2021-02-10 LAB — HEMOGLOBIN A1C
Hgb A1c MFr Bld: 6.5 % — ABNORMAL HIGH (ref 4.8–5.6)
Mean Plasma Glucose: 139.85 mg/dL

## 2021-02-10 LAB — APTT: aPTT: 27 seconds (ref 24–36)

## 2021-02-10 MED ORDER — SODIUM CHLORIDE 0.9% FLUSH
3.0000 mL | Freq: Two times a day (BID) | INTRAVENOUS | Status: DC
  Administered 2021-02-10 – 2021-02-11 (×2): 3 mL via INTRAVENOUS

## 2021-02-10 MED ORDER — ALBUTEROL SULFATE (2.5 MG/3ML) 0.083% IN NEBU: 2.5000 mg | INHALATION_SOLUTION | RESPIRATORY_TRACT | Status: DC | PRN

## 2021-02-10 MED ORDER — PANTOPRAZOLE SODIUM 40 MG PO TBEC
40.0000 mg | DELAYED_RELEASE_TABLET | Freq: Every day | ORAL | Status: DC
  Administered 2021-02-10 – 2021-02-11 (×2): 40 mg via ORAL
  Filled 2021-02-10 (×2): qty 1

## 2021-02-10 MED ORDER — SENNOSIDES-DOCUSATE SODIUM 8.6-50 MG PO TABS: 1.0000 | ORAL_TABLET | Freq: Every evening | ORAL | Status: DC | PRN

## 2021-02-10 MED ORDER — ACETAMINOPHEN 325 MG PO TABS
650.0000 mg | ORAL_TABLET | Freq: Once | ORAL | Status: AC
  Administered 2021-02-10: 650 mg via ORAL
  Filled 2021-02-10: qty 2

## 2021-02-10 MED ORDER — OSELTAMIVIR PHOSPHATE 75 MG PO CAPS
75.0000 mg | ORAL_CAPSULE | Freq: Once | ORAL | Status: AC
  Administered 2021-02-10: 75 mg via ORAL
  Filled 2021-02-10: qty 1

## 2021-02-10 MED ORDER — METFORMIN HCL 500 MG PO TABS
500.0000 mg | ORAL_TABLET | Freq: Every day | ORAL | Status: DC
  Administered 2021-02-11: 500 mg via ORAL
  Filled 2021-02-10: qty 1

## 2021-02-10 MED ORDER — OSELTAMIVIR PHOSPHATE 30 MG PO CAPS
30.0000 mg | ORAL_CAPSULE | Freq: Two times a day (BID) | ORAL | Status: DC
  Administered 2021-02-10 – 2021-02-11 (×3): 30 mg via ORAL
  Filled 2021-02-10 (×5): qty 1

## 2021-02-10 MED ORDER — ENOXAPARIN SODIUM 40 MG/0.4ML IJ SOSY
40.0000 mg | PREFILLED_SYRINGE | INTRAMUSCULAR | Status: DC
  Administered 2021-02-10 – 2021-02-11 (×2): 40 mg via SUBCUTANEOUS
  Filled 2021-02-10 (×2): qty 0.4

## 2021-02-10 MED ORDER — ACETAMINOPHEN 325 MG PO TABS: 650.0000 mg | ORAL_TABLET | Freq: Four times a day (QID) | ORAL | Status: DC | PRN

## 2021-02-10 MED ORDER — GUAIFENESIN 100 MG/5ML PO LIQD
5.0000 mL | ORAL | Status: DC | PRN
  Administered 2021-02-10 (×2): 5 mL via ORAL
  Filled 2021-02-10 (×2): qty 5

## 2021-02-10 MED ORDER — LACTATED RINGERS IV BOLUS
1000.0000 mL | Freq: Once | INTRAVENOUS | Status: AC
  Administered 2021-02-10: 1000 mL via INTRAVENOUS

## 2021-02-10 MED ORDER — METOCLOPRAMIDE HCL 10 MG PO TABS
10.0000 mg | ORAL_TABLET | Freq: Two times a day (BID) | ORAL | Status: DC
  Administered 2021-02-10 – 2021-02-11 (×2): 10 mg via ORAL
  Filled 2021-02-10 (×2): qty 1

## 2021-02-10 MED ORDER — LACTATED RINGERS IV SOLN: INTRAVENOUS | Status: AC

## 2021-02-10 MED ORDER — ONDANSETRON HCL 4 MG PO TABS: 4.0000 mg | ORAL_TABLET | Freq: Four times a day (QID) | ORAL | Status: DC | PRN

## 2021-02-10 MED ORDER — INSULIN ASPART 100 UNIT/ML IJ SOLN
0.0000 [IU] | Freq: Three times a day (TID) | INTRAMUSCULAR | Status: DC
  Administered 2021-02-10: 1 [IU] via SUBCUTANEOUS

## 2021-02-10 MED ORDER — ONDANSETRON HCL 4 MG/2ML IJ SOLN: 4.0000 mg | Freq: Four times a day (QID) | INTRAMUSCULAR | Status: DC | PRN

## 2021-02-10 MED ORDER — SODIUM CHLORIDE 0.9 % IV SOLN
2.0000 g | INTRAVENOUS | Status: DC
Start: 2021-02-10 — End: 2021-02-10
  Administered 2021-02-10: 2 g via INTRAVENOUS
  Filled 2021-02-10: qty 20

## 2021-02-10 MED ORDER — SODIUM CHLORIDE 0.9 % IV SOLN
500.0000 mg | INTRAVENOUS | Status: DC
  Administered 2021-02-10: 500 mg via INTRAVENOUS
  Filled 2021-02-10: qty 500

## 2021-02-10 MED ORDER — INFLUENZA VAC A&B SA ADJ QUAD 0.5 ML IM PRSY
0.5000 mL | PREFILLED_SYRINGE | INTRAMUSCULAR | Status: DC
  Filled 2021-02-10: qty 0.5

## 2021-02-10 MED ORDER — LACTATED RINGERS IV BOLUS (SEPSIS)
1000.0000 mL | Freq: Once | INTRAVENOUS | Status: AC
  Administered 2021-02-10: 1000 mL via INTRAVENOUS

## 2021-02-10 MED ORDER — LACTATED RINGERS IV SOLN: INTRAVENOUS | Status: DC

## 2021-02-10 MED ORDER — GABAPENTIN 300 MG PO CAPS: 300.0000 mg | ORAL_CAPSULE | Freq: Two times a day (BID) | ORAL | Status: DC | PRN

## 2021-02-10 MED ORDER — MOMETASONE FURO-FORMOTEROL FUM 200-5 MCG/ACT IN AERO
2.0000 | INHALATION_SPRAY | Freq: Two times a day (BID) | RESPIRATORY_TRACT | Status: DC
  Administered 2021-02-10 – 2021-02-11 (×3): 2 via RESPIRATORY_TRACT
  Filled 2021-02-10 (×2): qty 8.8

## 2021-02-10 MED ORDER — ACETAMINOPHEN 650 MG RE SUPP: 650.0000 mg | Freq: Four times a day (QID) | RECTAL | Status: DC | PRN

## 2021-02-10 MED ORDER — ALBUTEROL SULFATE HFA 108 (90 BASE) MCG/ACT IN AERS: 2.0000 | INHALATION_SPRAY | Freq: Four times a day (QID) | RESPIRATORY_TRACT | Status: DC | PRN

## 2021-02-10 NOTE — Progress Notes (Signed)
Patient ID: Penny Hernandez, female   DOB: April 20, 1940, 80 y.o.   MRN: [id number]  PROGRESS NOTE    Penny Hernandez  RXV:[id number] DOB: 09/09/1940 DOA: 02/10/2021 PCP: Shirline Frees, MD    Brief Narrative:  Patient is a 80 year old with PMH of T2DM, asthma, OSA, HLD who presents with upper respiratory symptoms and is positive for influenza A.   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)   Mild intermittent asthma   Renal insufficiency   Elevated LFTs    Influenza A - Presents with profound general weakness, loss of appetite, aches, and cough, and is found to have influenza A  - Started on Tamiflu in ED  - Continue Tamiflu and supportive care, monitor for complications     General weakness  - Presents with profound general weakness, loss of appetite, aches, and cough in setting influenza A  - No focal deficit identified, likely secondary to influenza  - Check serum CK -elevated at 448, continue IVF and supportive care, may need PT eval prior to d/c     Type II DM  - Check CBGs and use low-intensity SSI for now  -Continue metformin - A1c is 6.5 -On Neurontin 300 twice daily as needed will continue this for now   Renal insufficiency  - SCr is 1.13 on admission, up from 0.9 on most recent available labs (from 2013)  - CKD noted on problem list but family not aware of CKD hx of baseline renal fxn  - Renally-dose medications, continue IVF hydration until she is taking adequate PO, repeat chem panel in am     Elevated LFTs  - Mild elevations in alk phos, transaminases, and bilirubin noted on admission with normal INR  - Check RUQ US -shows increased echogenicity consistent with hepatic steatosis.  Check viral hepatitis panel, trend LFTs    SIRS  - Febrile, tachycardic, and tachypneic in ED with elevated lactate  - She was cultured and started on empiric abx but found to have  influenza A and no evidence of bacterial infection  - Treat flu as above, follow cultures, stop antibiotics for now, trend lactate -now down to 1.1 from 2.4   Asthma  - No wheezing on admission  - Continue as needed albuterol -Inhaled steroid  Hyperlipidemia - Holding Pravachol in setting of elevated LFTs  GERD - Continue PPI   DVT prophylaxis: Lovenox SQ Code Status: DNR  Family Communication: Daughter at bedside Disposition Plan: Home, patient remains inpatient due to unsafe discharge plan and weakness.  Assume this will get better.  Will need physical therapy evaluation prior to discharge.  Blood cultures are pending   Consultants:  None  Procedures: None  Antimicrobials: Anti-infectives (From admission, onward)    Start     Dose/Rate Route Frequency Ordered Stop   02/10/21 1430  oseltamivir (TAMIFLU) capsule 30 mg        30 mg Oral 2 times daily 02/10/21 0637 02/15/21 0959   02/10/21 0230  oseltamivir (TAMIFLU) capsule 75 mg        75 mg Oral  Once 02/10/21 0218 02/10/21 0328   02/10/21 0100  cefTRIAXone (ROCEPHIN) 2 g in sodium chloride 0.9 % 100 mL IVPB  Status:  Discontinued        2 g 200 mL/hr over 30 Minutes Intravenous Every 24 hours 02/10/21 0049 02/10/21 0637   02/10/21 0100  azithromycin (ZITHROMAX) 500 mg  in sodium chloride 0.9 % 250 mL IVPB  Status:  Discontinued        500 mg 250 mL/hr over 60 Minutes Intravenous Every 24 hours 02/10/21 0049 02/10/21 7782        Subjective: Feels like she is improving.  Objective: Vitals:   02/10/21 0915 02/10/21 0930 02/10/21 0945 02/10/21 1000  BP: (!) 149/76 (!) 155/81 (!) 163/72 117/73  Pulse: (!) 105 (!) 109 (!) 108 (!) 104  Resp: 20 (!) 21 (!) 25 17  Temp:      TempSrc:      SpO2: 100% 100% 100% 100%  Weight:      Height:        Intake/Output Summary (Last 24 hours) at 02/10/2021 1102 Last data filed at 02/10/2021 0705 Gross per 24 hour  Intake --  Output 1000 ml  Net -1000 ml   Filed Weights    02/10/21 0758  Weight: 98 kg    Examination:  General exam: Appears calm and comfortable  Respiratory system: Clear to auscultation.  Mild tachypnea Cardiovascular system: S1 & S2 heard, tachycardic in the low 100s Gastrointestinal system: Abdomen is nondistended, soft and nontender.  Central nervous system: Alert and oriented. No focal neurological deficits. Extremities: Symmetric  Skin: No rashes Psychiatry: Judgement and insight appear normal. Mood & affect appropriate.     Data Reviewed: I have personally reviewed following labs and imaging studies  CBC: Recent Labs  Lab 02/10/21 0101  WBC 8.2  NEUTROABS 7.3  HGB 12.8  HCT 41.1  MCV 96.9  PLT 423   Basic Metabolic Panel: Recent Labs  Lab 02/10/21 0101  NA 137  K 3.9  CL 105  CO2 23  GLUCOSE 186*  BUN 14  CREATININE 1.13*  CALCIUM 9.3   GFR: Estimated Creatinine Clearance: 47.7 mL/min (A) (by C-G formula based on SCr of 1.13 mg/dL (H)). Liver Function Tests: Recent Labs  Lab 02/10/21 0101  AST 65*  ALT 73*  ALKPHOS 144*  BILITOT 1.6*  PROT 7.3  ALBUMIN 3.5    Coagulation Profile: Recent Labs  Lab 02/10/21 0101  INR 1.1   Cardiac Enzymes: Recent Labs  Lab 02/10/21 0800  CKTOTAL 448*    HbA1C: Recent Labs    02/10/21 0800  HGBA1C 6.5*   CBG: Recent Labs  Lab 02/10/21 0809  GLUCAP 136*    Sepsis Labs: Recent Labs  Lab 02/10/21 0100 02/10/21 0800  LATICACIDVEN 2.4* 1.1    Recent Results (from the past 240 hour(s))  Resp Panel by RT-PCR (Flu A&B, Covid) Nasopharyngeal Swab     Status: Abnormal   Collection Time: 02/10/21 12:49 AM   Specimen: Nasopharyngeal Swab; Nasopharyngeal(NP) swabs in vial transport medium  Result Value Ref Range Status   SARS Coronavirus 2 by RT PCR NEGATIVE NEGATIVE Final    Comment: (NOTE) SARS-CoV-2 target nucleic acids are NOT DETECTED.  The SARS-CoV-2 RNA is generally detectable in upper respiratory specimens during the acute phase of  infection. The lowest concentration of SARS-CoV-2 viral copies this assay can detect is 138 copies/mL. A negative result does not preclude SARS-Cov-2 infection and should not be used as the sole basis for treatment or other patient management decisions. A negative result may occur with  improper specimen collection/handling, submission of specimen other than nasopharyngeal swab, presence of viral mutation(s) within the areas targeted by this assay, and inadequate number of viral copies(<138 copies/mL). A negative result must be combined with clinical observations, patient history, and epidemiological information. The expected  result is Negative.  Fact Sheet for Patients:  EntrepreneurPulse.com.au  Fact Sheet for Healthcare Providers:  IncredibleEmployment.be  This test is no t yet approved or cleared by the [place] FDA and  has been authorized for detection and/or diagnosis of SARS-CoV-2 by FDA under an Emergency Use Authorization (EUA). This EUA will remain  in effect (meaning this test can be used) for the duration of the COVID-19 declaration under Section 564(b)(1) of the Act, 21 U.S.C.section 360bbb-3(b)(1), unless the authorization is terminated  or revoked sooner.       Influenza A by PCR POSITIVE (A) NEGATIVE Final   Influenza B by PCR NEGATIVE NEGATIVE Final    Comment: (NOTE) The Xpert Xpress SARS-CoV-2/FLU/RSV plus assay is intended as an aid in the diagnosis of influenza from Nasopharyngeal swab specimens and should not be used as a sole basis for treatment. Nasal washings and aspirates are unacceptable for Xpert Xpress SARS-CoV-2/FLU/RSV testing.  Fact Sheet for Patients: EntrepreneurPulse.com.au  Fact Sheet for Healthcare Providers: IncredibleEmployment.be  This test is not yet approved or cleared by the [place] FDA and has been authorized for detection and/or diagnosis of  SARS-CoV-2 by FDA under an Emergency Use Authorization (EUA). This EUA will remain in effect (meaning this test can be used) for the duration of the COVID-19 declaration under Section 564(b)(1) of the Act, 21 U.S.C. section 360bbb-3(b)(1), unless the authorization is terminated or revoked.  Performed at [facility], [place] [address] [place], [place] [address]       Radiology Studies: DG Chest Port 1 View  Result Date: 02/10/2021 CLINICAL DATA:  Possible sepsis EXAM: PORTABLE CHEST 1 VIEW COMPARISON:  09/26/2013 FINDINGS: The heart size and mediastinal contours are within normal limits. Both lungs are clear. The visualized skeletal structures are unremarkable. IMPRESSION: No active disease. Electronically Signed   By: Inez Catalina M.D.   On: 02/10/2021 01:05   US Abdomen Limited RUQ (LIVER/GB)  Result Date: 02/10/2021 CLINICAL DATA:  Right upper quadrant pain EXAM: ULTRASOUND ABDOMEN LIMITED RIGHT UPPER QUADRANT COMPARISON:  None. FINDINGS: Gallbladder: No gallstones or wall thickening visualized. No sonographic Murphy sign noted by sonographer. Common bile duct: Diameter: 4 Liver: Increased echogenicity of the parenchyma with no focal mass identified. Portal vein is patent on color Doppler imaging with normal direction of blood flow towards the liver. Other: None. IMPRESSION: Increased echogenicity liver parenchyma which may represent hepatic steatosis and/or other hepatocellular disease. Electronically Signed   By: Ofilia Neas M.D.   On: 02/10/2021 07:39     Scheduled Meds:  enoxaparin (LOVENOX) injection  40 mg Subcutaneous Q24H   insulin aspart  0-6 Units Subcutaneous TID WC   oseltamivir  30 mg Oral BID   sodium chloride flush  3 mL Intravenous Q12H   Continuous Infusions:  lactated ringers 100 mL/hr at 02/10/21 0753     LOS: 0 days    Donnamae Jude, MD 02/10/2021 11:02 AM [phone] [facility] If 7PM-7AM, please contact night-coverage 02/10/2021,  11:02 AM

## 2021-02-10 NOTE — ED Notes (Signed)
Called nurse, she said call back in 5 min

## 2021-02-10 NOTE — ED Notes (Signed)
Called nurse to give report, States she is unable to take report at this time will try back.

## 2021-02-10 NOTE — ED Notes (Signed)
Spoke with charge nurse on floor and ed charge, will take patient upstairs and give bedside report

## 2021-02-10 NOTE — H&P (Signed)
History and Physical    Penny Hernandez FVO:[id number] DOB: [birth year] DOA: 02/10/2021  PCP: Shirline Frees, MD   Patient coming from: Home   Chief Complaint: Cough, aches, weakness   HPI: Penny Hernandez is a pleasant 80 y.o. female with medical history significant for type 2 diabetes mellitus, asthma, sleep apnea, and hyperlipidemia, now presenting to the emergency department for evaluation of aches, loss of appetite, cough, sore throat, fatigue, and general weakness.  Patient reports that she had been in her usual state of health until 4 days ago when she developed cough and sore throat.  She is gone on to have a loss of appetite and profound fatigue.  She was generally weak to the point where she was unable to get out of bed yesterday.  She denies any abdominal pain, nausea, or vomiting, but has not been eating or drinking much of anything in the past 2 days.  She denies any acute change in vision or hearing and denies focal numbness or weakness.  She is not aware of any sick contacts.  ED Course: Upon arrival to the ED, patient is found to be febrile to 38.1 C, tachycardic to 130s, tachypneic, and with stable blood pressure.  EKG features sinus tachycardia with rate of 133.  Chest x-ray negative for acute cardiopulmonary disease.  Chemistry panel with glucose 186, creatinine 1.13, and mild elevations in alkaline phosphatase, transaminases, and bilirubin.  CBC is unremarkable.  Lactic acid elevated to 2.4.  Influenza a is positive.  Blood and urine cultures were collected in the ED and the patient was given 2 L of saline, Rocephin, azithromycin, acetaminophen, and Tamiflu.  Review of Systems:  All other systems reviewed and apart from HPI, are negative.  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   History of kidney stones    Hyperlipidemia    Joint pain OCCASIONAL   KNEES, LEGS, BACK   Sleep apnea CPAP EVERY NIGHT   pt states "stopped 1 month ago"   Ureteral calculus, left    Urgency of urination OCCASIONAL    Past Surgical History:  Procedure Laterality Date   BREAST BIOPSY  01/06/2012   Procedure: BREAST BIOPSY WITH NEEDLE LOCALIZATION;  Surgeon: Pedro Earls, MD;  Location: [place];  Service: General;  Laterality: Right;  Right needle localization breast biospy   CATARACT EXTRACTION, BILATERAL     bilateral   COLONOSCOPY  12/2003   CYSTOSCOPY/RETROGRADE/URETEROSCOPY/STONE EXTRACTION WITH BASKET  03/06/2011   Procedure: CYSTOSCOPY/RETROGRADE/URETEROSCOPY/STONE EXTRACTION WITH BASKET;  Surgeon: Claybon Jabs, MD;  Location: [facility];  Service: Urology;  Laterality: Left;   DILATION AND CURETTAGE OF UTERUS  YRS AGO   X2   INNER EAR SURGERY  LEFT -- YRS AGO   TO IMPROVE HEARING-- UNSUCCESSFUL   OVARIAN CYST SURGERY  YRS AGO   SVD     x 3   UPPER GASTROINTESTINAL ENDOSCOPY  12/2003   URETEROSCOPY  03/06/2011   Procedure: URETEROSCOPY;  Surgeon: Claybon Jabs, MD;  Location: [facility];  Service: Urology;  Laterality: Left;  C-arm, camera, Digital Ureteroscope, Holmium Laser needed    Social History:   reports that she has never smoked. She has never used smokeless tobacco. She reports that she does not drink alcohol and does not use drugs.  Allergies  Allergen Reactions   Hydrocodone-Acetaminophen     Other reaction(s): insomnia    Family History  Problem Relation Age of Onset   Colon cancer Father    Breast cancer Mother      Prior to Admission medications   Medication Sig Start Date End Date Taking? Authorizing Provider  albuterol (PROVENTIL HFA;VENTOLIN HFA) 108 (90 BASE) MCG/ACT inhaler  Inhale 2 puffs into the lungs every 6 (six) hours as needed. For asthma    [provider]  augmented betamethasone dipropionate (DIPROLENE-AF) 0.05 % cream betamethasone, augmented 0.05 % topical cream    [provider]  celecoxib (CELEBREX) 100 MG capsule TK ONE C PO QD 09/20/18   [provider]  cholecalciferol (VITAMIN D) 1000 UNITS tablet Take 1,000 Units by mouth daily.    [provider]  ciprofloxacin (CIPRO) 250 MG tablet TK 1 T PO Q 12 H FOR 7 DAYS 11/16/18   [provider]  clotrimazole-betamethasone (LOTRISONE) cream APPLY 1 APPLICATION TO AFFECTED AREA AS NEEDED 09/05/14   [provider]  esomeprazole (NEXIUM) 40 MG capsule Take 40-80 mg by mouth daily. Sometimes takes 2 on some days for reflux    [provider]  fluconazole (DIFLUCAN) 200 MG tablet Take 200 mg by mouth daily. 01/23/20   [provider]  fluticasone (VERAMYST) 27.5 MCG/SPRAY nasal spray Veramyst 27.5 mcg/actuation nasal spray,suspension    [provider]  Fluticasone-Salmeterol (ADVAIR) 250-50 MCG/DOSE AEPB Inhale 1 puff into the lungs as needed. Pt does not take this scheduled. Takes it as needed for asthma.    [provider]  FREESTYLE LITE test strip U TO CHECK CBG ONCE A DAY 12/04/17   [provider]  gabapentin (NEURONTIN) 100 MG capsule  08/26/18   [provider]  gabapentin (NEURONTIN) 300 MG capsule TAKE 1 CAPSULE BY MOUTH DAILY FOR NERVE PAIN 06/18/16   [provider]  hydroquinone 4 % cream  06/06/14   [provider]  meclizine (ANTIVERT) 12.5 MG tablet Take 12.5 mg by mouth as needed for dizziness.    [provider]  metFORMIN (GLUCOPHAGE) 500 MG tablet Take 500 mg by mouth daily with breakfast.    [provider]  metFORMIN (GLUCOPHAGE) 500 MG tablet 1 tablet with meals    [provider]  metoCLOPramide (REGLAN) 10 MG tablet Take 10 mg by mouth 3 (three)  times a week.    [provider]  oxybutynin (DITROPAN) 5 MG tablet TK 1 T PO BID IF NEEDED FOR URINARY FREQUENCY 05/16/18   [provider]  polyethylene glycol-electrolytes (NULYTELY) 420 g solution GaviLyte-N 420 gram oral solution    [provider]  pravastatin (PRAVACHOL) 40 MG tablet Take 40 mg by mouth daily.    [provider]  scopolamine (TRANSDERM-SCOP) 1 MG/3DAYS APPLY 1 PATCH BEHIND EAR EVERY 72 HOURS AS NEEDED FOR MOTION SICKNESS 08/21/14   [provider]  solifenacin (VESICARE) 10 MG tablet TK 1 T PO QD FOR URINARY FREQUENCY 11/16/18   [provider]  sulfamethoxazole-trimethoprim (BACTRIM DS) 800-160 MG tablet TK 1 T PO BID FOR 7 DAYS 07/22/18   [provider]  sulindac (CLINORIL) 150 MG tablet Take 200 mg by mouth 2 (two)  times daily as needed.    [provider]  TRANSDERM-SCOP, 1.5 MG, 1 MG/3DAYS APPLY 1 PATCH BEHIND EAR EVERY 72 HOURS AS NEEDED FOR MOTION SICKNESS 08/21/14   [provider]  TRI-LUMA 0.01-4-0.05 % CREA  11/09/14   [provider]  triamcinolone cream (KENALOG) 0.1 % APPLY TO NECK 2 TIMES DAILY FOR 2 WEEKS, THEN DAILY FOR 2 WEEKS, ...  (REFER TO PRESCRIPTION NOTES). 11/07/14   [provider]  triamcinolone cream (KENALOG) 0.1 % 1 application sparingly to affected area 06/29/16   [provider]    Physical Exam: Vitals:   02/10/21 0330 02/10/21 0400 02/10/21 0430 02/10/21 0500  BP: (!) 144/61 (!) 145/69 137/68 (!) 142/66  Pulse: (!) 117 (!) 114 (!) 110 (!) 108  Resp: 18 (!) 22 (!) 21 (!) 23  Temp: 98.7 F (37.1 C)     TempSrc: Oral     SpO2: 99% 99% 100% 100%    Constitutional: NAD, calm  Eyes: PERTLA, lids and conjunctivae normal ENMT: Mucous membranes are dry. Posterior pharynx clear of any exudate or lesions.   Neck: supple, no masses  Respiratory: mild tachypnea, no wheezing, no crackles. No accessory muscle use.  Cardiovascular: Rate ~120 and  regular. No extremity edema.  Abdomen: No distension, no tenderness, soft. Bowel sounds active.  Musculoskeletal: no clubbing / cyanosis. No joint deformity upper and lower extremities.   Skin: Poor turgor. Warm, dry, well-perfused. Neurologic: Gross hearing deficit, CN 2-12 grossly intact otherwise. Strength 5/5 in all 4 limbs. Alert and oriented.  Psychiatric: Very pleasant. Cooperative.    Labs and Imaging on Admission: I have personally reviewed following labs and imaging studies  CBC: Recent Labs  Lab 02/10/21 0101  WBC 8.2  NEUTROABS 7.3  HGB 12.8  HCT 41.1  MCV 96.9  PLT 025   Basic Metabolic Panel: Recent Labs  Lab 02/10/21 0101  NA 137  K 3.9  CL 105  CO2 23  GLUCOSE 186*  BUN 14  CREATININE 1.13*  CALCIUM 9.3   GFR: CrCl cannot be calculated (Unknown ideal weight.). Liver Function Tests: Recent Labs  Lab 02/10/21 0101  AST 65*  ALT 73*  ALKPHOS 144*  BILITOT 1.6*  PROT 7.3  ALBUMIN 3.5   No results for input(s): LIPASE, AMYLASE in the last 168 hours. No results for input(s): AMMONIA in the last 168 hours. Coagulation Profile: Recent Labs  Lab 02/10/21 0101  INR 1.1   Cardiac Enzymes: No results for input(s): CKTOTAL, CKMB, CKMBINDEX, TROPONINI in the last 168 hours. BNP (last 3 results) No results for input(s): PROBNP in the last 8760 hours. HbA1C: No results for input(s): HGBA1C in the last 72 hours. CBG: No results for input(s): GLUCAP in the last 168 hours. Lipid Profile: No results for input(s): CHOL, HDL, LDLCALC, TRIG, CHOLHDL, LDLDIRECT in the last 72 hours. Thyroid Function Tests: No results for input(s): TSH, T4TOTAL, FREET4, T3FREE, THYROIDAB in the last 72 hours. Anemia Panel: No results for input(s): VITAMINB12, FOLATE, FERRITIN, TIBC, IRON, RETICCTPCT in the last 72 hours. Urine analysis:    Component Value Date/Time   COLORURINE YELLOW 02/10/2021 0252   APPEARANCEUR CLEAR 02/10/2021 0252   LABSPEC 1.009 02/10/2021 0252    PHURINE 7.0 02/10/2021 0252   GLUCOSEU 50 (A) 02/10/2021 0252   HGBUR NEGATIVE 02/10/2021 0252   BILIRUBINUR NEGATIVE 02/10/2021 0252   BILIRUBINUR Negative 10/19/2011 1713   KETONESUR NEGATIVE 02/10/2021 0252   PROTEINUR 30 (A) 02/10/2021 0252   UROBILINOGEN 1.0 10/19/2011 1713  NITRITE NEGATIVE 02/10/2021 0252   LEUKOCYTESUR NEGATIVE 02/10/2021 0252   Sepsis Labs: @LABRCNTIP (procalcitonin:4,lacticidven:4) ) Recent Results (from the past 240 hour(s))  Resp Panel by RT-PCR (Flu A&B, Covid) Nasopharyngeal Swab     Status: Abnormal   Collection Time: 02/10/21 12:49 AM   Specimen: Nasopharyngeal Swab; Nasopharyngeal(NP) swabs in vial transport medium  Result Value Ref Range Status   SARS Coronavirus 2 by RT PCR NEGATIVE NEGATIVE Final    Comment: (NOTE) SARS-CoV-2 target nucleic acids are NOT DETECTED.  The SARS-CoV-2 RNA is generally detectable in upper respiratory specimens during the acute phase of infection. The lowest concentration of SARS-CoV-2 viral copies this assay can detect is 138 copies/mL. A negative result does not preclude SARS-Cov-2 infection and should not be used as the sole basis for treatment or other patient management decisions. A negative result may occur with  improper specimen collection/handling, submission of specimen other than nasopharyngeal swab, presence of viral mutation(s) within the areas targeted by this assay, and inadequate number of viral copies(<138 copies/mL). A negative result must be combined with clinical observations, patient history, and epidemiological information. The expected result is Negative.  Fact Sheet for Patients:  EntrepreneurPulse.com.au  Fact Sheet for Healthcare Providers:  IncredibleEmployment.be  This test is no t yet approved or cleared by the [place] FDA and  has been authorized for detection and/or diagnosis of SARS-CoV-2 by FDA under an Emergency Use Authorization  (EUA). This EUA will remain  in effect (meaning this test can be used) for the duration of the COVID-19 declaration under Section 564(b)(1) of the Act, 21 U.S.C.section 360bbb-3(b)(1), unless the authorization is terminated  or revoked sooner.       Influenza A by PCR POSITIVE (A) NEGATIVE Final   Influenza B by PCR NEGATIVE NEGATIVE Final    Comment: (NOTE) The Xpert Xpress SARS-CoV-2/FLU/RSV plus assay is intended as an aid in the diagnosis of influenza from Nasopharyngeal swab specimens and should not be used as a sole basis for treatment. Nasal washings and aspirates are unacceptable for Xpert Xpress SARS-CoV-2/FLU/RSV testing.  Fact Sheet for Patients: EntrepreneurPulse.com.au  Fact Sheet for Healthcare Providers: IncredibleEmployment.be  This test is not yet approved or cleared by the [place] FDA and has been authorized for detection and/or diagnosis of SARS-CoV-2 by FDA under an Emergency Use Authorization (EUA). This EUA will remain in effect (meaning this test can be used) for the duration of the COVID-19 declaration under Section 564(b)(1) of the Act, 21 U.S.C. section 360bbb-3(b)(1), unless the authorization is terminated or revoked.  Performed at [facility], [place] [address], [place], [place] [address]      Radiological Exams on Admission: DG Chest Port 1 View  Result Date: 02/10/2021 CLINICAL DATA:  Possible sepsis EXAM: PORTABLE CHEST 1 VIEW COMPARISON:  09/26/2013 FINDINGS: The heart size and mediastinal contours are within normal limits. Both lungs are clear. The visualized skeletal structures are unremarkable. IMPRESSION: No active disease. Electronically Signed   By: Inez Catalina M.D.   On: 02/10/2021 01:05    EKG: Independently reviewed. Sinus tachycardia, rate 133, LAFB.   Assessment/Plan  1. Influenza A - Presents with profound general weakness, loss of appetite, aches, and cough, and is found to have  influenza A  - Started on Tamiflu in ED  - Continue Tamiflu and supportive care, monitor for complications    2. General weakness  - Presents with profound general weakness, loss of appetite, aches, and cough in setting influenza A  - No focal  deficit identified, likely secondary to influenza  - Check serum CK, continue IVF and supportive care, may need PT eval prior to d/c    3. Type II DM  - Check CBGs and use low-intensity SSI for now   4. Renal insufficiency  - SCr is 1.13 on admission, up from 0.9 on most recent available labs (from 2013)  - CKD noted on problem list but family not aware of CKD hx of baseline renal fxn  - Renally-dose medications, continue IVF hydration until she is taking adequate PO, repeat chem panel in am    5. Elevated LFTs  - Mild elevations in alk phos, transaminases, and bilirubin noted on admission with normal INR  - Check RUQ [place] and viral hepatitis panel, trend LFTs   6. SIRS  - Febrile, tachycardic, and tachypneic in ED with elevated lactate  - She was cultured and started on empiric abx but found to have influenza A and no evidence of bacterial infection  - Treat flu as above, follow cultures, stop antibiotics for now, trend lactate   7. Asthma  - No wheezing on admission  - Continue as needed albuterol     DVT prophylaxis: Lovenox  Code Status: DNR, discussed with patient in ED   Level of Care: Level of care: Telemetry Medical Family Communication: Daughter [facility] Hazle Nordmann) updated by phone  Disposition Plan:  Patient is from: Home  Anticipated d/c is to: TBD Anticipated d/c date is: 02/13/21 Patient currently: Pending RUQ [place], repeat lactate, CK level, possible PT eval   Consults called: none  Admission status: Inpatient. Pt still tachycardic at rest and profoundly weak and unable to stand despite IVF and antipyretics. She also has new renal insufficiency and elevated LFTs making it unlikely that she will be stable for safe discharge within  observation timeframe.    Vianne Bulls, MD [facility]  02/10/2021, 6:38 AM

## 2021-02-10 NOTE — Progress Notes (Signed)
[facility] night cross cover note:  I was notified of patient's request for resumption of home nocturnal CPAP in the setting of known h/o obstructive sleep apnea.  I subsequently placed an order for resumption of home nocturnal CPAP.       Babs Bertin, DO Hospitalist

## 2021-02-10 NOTE — ED Notes (Signed)
Transported to [place].

## 2021-02-10 NOTE — ED Provider Notes (Signed)
[facility] EMERGENCY DEPARTMENT Provider Note   CSN: [id number] Arrival date & time: 02/10/21  0026     History Chief Complaint  Patient presents with   Weakness    Penny Hernandez is a 80 y.o. female.  Level 5 caveat for acuity of condition.  Patient brought in by EMS with a 2-day history of cough, chills, generalized weakness.  Laying in bed for 2 days and had a fall while trying to get out of bed and was too weak to get up so EMS was called.  Denies any injury from the fall.  Denies hitting her head or losing consciousness.  Patient reports 2 days of cough and cold symptoms with congestion, runny nose, sore throat and cough productive of clear mucus.  Unknown fevers at home.  Febrile to 103 for EMS.  Tachycardic and febrile on arrival.  Has had poor p.o. intake and laying around for 2 days not eating or drinking much.  No vomiting or diarrhea.  Denies headache, chest pain, abdominal pain.  No sick contacts at home. Patient with history of diabetes, CKD, deafness, kidney stones. EMS gave her 700 mL of fluid but no antipyretics  The history is provided by the patient and a relative.  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   History of kidney stones    Hyperlipidemia    Joint pain OCCASIONAL   KNEES, LEGS, BACK   Sleep apnea CPAP EVERY NIGHT   pt states "stopped 1 month ago"   Ureteral calculus, left    Urgency of urination  OCCASIONAL    Patient Active Problem List   Diagnosis Date Noted   Allergic rhinitis 09/25/2020   Benign paroxysmal positional vertigo 09/25/2020   Chronic obstructive pulmonary disease, unspecified ([place]) 09/25/2020   Dysphagia 09/25/2020   Family history of colonic polyps 09/25/2020   Gastro-esophageal reflux disease without esophagitis 09/25/2020   Hypertensive retinopathy 09/25/2020   Mild intermittent asthma 09/25/2020   Obstructive sleep apnea syndrome 09/25/2020   Overactive bladder 09/25/2020   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/25/2020   Sciatica 09/25/2020   Type 2 diabetes mellitus without complications ([place]) 82/99/3716   Vitamin D deficiency 09/25/2020   Chronic arthropathy 04/10/2020   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   Hemorrhoids 08/24/2017   Pelvic mass 08/24/2017   Skin inflammation 11/09/2014   Post inflammatory hypopigmentation 11/09/2014   Papilloma of breast 12/18/2011   Pelvic pain in female 10/19/2011   Hydrosalpinx 10/19/2011   Hematochezia 10/19/2011   Obesity 10/19/2011   Cystocele 10/19/2011   S/P abdominal hysterectomy and left salpingo-oophorectomy 10/19/2011   Diabetes mellitus ([place]) 10/19/2011   Hypercholesterolemia 10/19/2011    Past Surgical History:  Procedure Laterality Date   BREAST BIOPSY  01/06/2012   Procedure: BREAST BIOPSY WITH NEEDLE LOCALIZATION;  Surgeon: Isabel Caprice  Hassell Done, MD;  Location: [place];  Service: General;  Laterality: Right;  Right needle localization breast biospy   CATARACT EXTRACTION, BILATERAL     bilateral   COLONOSCOPY  12/2003   CYSTOSCOPY/RETROGRADE/URETEROSCOPY/STONE EXTRACTION WITH BASKET  03/06/2011   Procedure: CYSTOSCOPY/RETROGRADE/URETEROSCOPY/STONE EXTRACTION WITH BASKET;  Surgeon: Claybon Jabs, MD;  [facility]  Service: Urology;  Laterality: Left;   DILATION AND CURETTAGE OF UTERUS  YRS AGO   X2   INNER EAR SURGERY  LEFT --  YRS AGO   TO IMPROVE HEARING-- UNSUCCESSFUL   OVARIAN CYST SURGERY  YRS AGO   SVD     x 3   UPPER GASTROINTESTINAL ENDOSCOPY  12/2003   URETEROSCOPY  03/06/2011   Procedure: URETEROSCOPY;  Surgeon: Claybon Jabs, MD;  Location: [facility];  Service: Urology;  Laterality: Left;  C-arm, camera, Digital Ureteroscope, Holmium Laser needed     OB History     Gravida  3   Para  3   Term      Preterm      AB      Living  3      SAB      IAB      Ectopic      Multiple      Live Births              Family History  Problem Relation Age of Onset   Colon cancer Father    Breast cancer Mother     Social History   Tobacco Use   Smoking status: Never   Smokeless tobacco: Never  Substance Use Topics   Alcohol use: No   Drug use: No    Home Medications Prior to Admission medications   Medication Sig Start Date End Date Taking? Authorizing Provider  albuterol (PROVENTIL HFA;VENTOLIN HFA) 108 (90 BASE) MCG/ACT inhaler Inhale 2 puffs into the lungs every 6 (six) hours as needed. For asthma    [provider]  augmented betamethasone dipropionate (DIPROLENE-AF) 0.05 % cream betamethasone, augmented 0.05 % topical cream    [provider]  celecoxib (CELEBREX) 100 MG capsule TK ONE C PO QD 09/20/18   [provider]  cholecalciferol (VITAMIN D) 1000 UNITS tablet Take 1,000 Units by mouth daily.    [provider]  ciprofloxacin (CIPRO) 250 MG tablet TK 1 T PO Q 12 H FOR 7 DAYS 11/16/18   [provider]  clotrimazole-betamethasone (LOTRISONE) cream APPLY 1 APPLICATION TO AFFECTED AREA AS NEEDED 09/05/14   [provider]  esomeprazole (NEXIUM) 40 MG capsule Take 40-80 mg by mouth daily. Sometimes takes 2 on some days for reflux    [provider]  fluconazole (DIFLUCAN) 200 MG tablet Take 200 mg by mouth daily. 01/23/20   [provider]  fluticasone (VERAMYST) 27.5 MCG/SPRAY nasal spray  Veramyst 27.5 mcg/actuation nasal spray,suspension    [provider]  Fluticasone-Salmeterol (ADVAIR) 250-50 MCG/DOSE AEPB Inhale 1 puff into the lungs as needed. Pt does not take this scheduled. Takes it as needed for asthma.    [provider]  FREESTYLE LITE test strip U TO CHECK CBG ONCE A DAY 12/04/17   [provider]  gabapentin (NEURONTIN) 100 MG capsule  08/26/18   [provider]  gabapentin (NEURONTIN) 300 MG capsule TAKE 1 CAPSULE BY MOUTH DAILY FOR NERVE PAIN 06/18/16   [provider]  hydroquinone 4 % cream  06/06/14   [provider]  meclizine (ANTIVERT) 12.5 MG tablet Take 12.5 mg by mouth as needed for dizziness.    [provider]  metFORMIN (GLUCOPHAGE) 500 MG tablet Take 500 mg by mouth daily with breakfast.    [provider]  metFORMIN (GLUCOPHAGE) 500 MG tablet 1 tablet with meals    [provider]  metoCLOPramide (REGLAN) 10 MG tablet Take 10 mg by mouth 3 (three) times a week.    [provider]  oxybutynin (DITROPAN) 5 MG tablet TK 1 T PO BID IF NEEDED FOR URINARY FREQUENCY 05/16/18   [provider]  polyethylene glycol-electrolytes (NULYTELY) 420 g solution GaviLyte-N 420 gram oral solution    [provider]  pravastatin (PRAVACHOL) 40 MG tablet Take 40 mg by mouth daily.    [provider]  scopolamine (TRANSDERM-SCOP) 1 MG/3DAYS APPLY 1 PATCH BEHIND EAR EVERY 72 HOURS AS NEEDED FOR MOTION SICKNESS 08/21/14   [provider]  solifenacin (VESICARE) 10 MG tablet TK 1 T PO QD FOR URINARY FREQUENCY 11/16/18   [provider]  sulfamethoxazole-trimethoprim (BACTRIM DS) 800-160 MG tablet TK 1 T PO BID FOR 7 DAYS 07/22/18   [provider]  sulindac (CLINORIL) 150 MG tablet Take 200 mg by mouth 2 (two) times daily as needed.    [provider]  TRANSDERM-SCOP, 1.5 MG, 1 MG/3DAYS APPLY 1 PATCH BEHIND EAR EVERY 72 HOURS AS NEEDED  FOR MOTION SICKNESS 08/21/14   [provider]  TRI-LUMA 0.01-4-0.05 % CREA  11/09/14   [provider]  triamcinolone cream (KENALOG) 0.1 % APPLY TO NECK 2 TIMES DAILY FOR 2 WEEKS, THEN DAILY FOR 2 WEEKS, ...  (REFER TO PRESCRIPTION NOTES). 11/07/14   [provider]  triamcinolone cream (KENALOG) 0.1 % 1 application sparingly to affected area 06/29/16   [provider]    Allergies    Hydrocodone-acetaminophen  Review of Systems   Review of Systems  Constitutional:  Positive for activity change, appetite change, fatigue and fever.  HENT:  Positive for congestion, rhinorrhea and sore throat.   Eyes:  Negative for visual disturbance.  Respiratory:  Positive for cough. Negative for shortness of breath.   Cardiovascular:  Negative for chest pain.  Gastrointestinal:  Negative for abdominal pain, nausea and vomiting.  Genitourinary:  Negative for dysuria, flank pain, frequency and hematuria.  Musculoskeletal:  Positive for arthralgias and myalgias.  Skin:  Negative for rash.  Neurological:  Positive for dizziness, weakness and light-headedness. Negative for headaches.   all other systems are negative except as noted in the HPI and PMH.   Physical Exam Updated Vital Signs BP (!) 146/78   Pulse (!) 133   Temp (!) 100.5 F (38.1 C) (Oral)   Resp (!) 24   SpO2 100%   Physical Exam Vitals and nursing note reviewed.  Constitutional:      General: She is not in acute distress.    Appearance: She is well-developed. She is ill-appearing.     Comments: Moist cough, ill-appearing but nontoxic.  HENT:     Head: Normocephalic and atraumatic.     Mouth/Throat:     Pharynx: No oropharyngeal exudate.  Eyes:     Conjunctiva/sclera: Conjunctivae normal.     Pupils: Pupils are equal, round, and reactive to light.  Neck:     Comments: No meningismus. Cardiovascular:     Rate and Rhythm: Regular rhythm. Tachycardia present.     Heart sounds: Normal heart sounds.  No murmur heard. Pulmonary:  Effort: Pulmonary effort is normal. No respiratory distress.     Breath sounds: Rhonchi present.  Abdominal:     Palpations: Abdomen is soft.     Tenderness: There is no abdominal tenderness. There is no guarding or rebound.  Musculoskeletal:        General: No tenderness. Normal range of motion.     Cervical back: Normal range of motion and neck supple.  Skin:    General: Skin is warm.  Neurological:     Mental Status: She is alert and oriented to person, place, and time.     Cranial Nerves: No cranial nerve deficit.     Motor: No abnormal muscle tone.     Coordination: Coordination normal.     Comments:  5/5 strength throughout. CN 2-12 intact.Equal grip strength.   Psychiatric:        Behavior: Behavior normal.    ED Results / Procedures / Treatments   Labs (all labs ordered are listed, but only abnormal results are displayed) Labs Reviewed  RESP PANEL BY RT-PCR (FLU A&B, COVID) ARPGX2 - Abnormal; Notable for the following components:      Result Value   Influenza A by PCR POSITIVE (*)    All other components within normal limits  LACTIC ACID, PLASMA - Abnormal; Notable for the following components:   Lactic Acid, Venous 2.4 (*)    All other components within normal limits  COMPREHENSIVE METABOLIC PANEL - Abnormal; Notable for the following components:   Glucose, Bld 186 (*)    Creatinine, Ser 1.13 (*)    AST 65 (*)    ALT 73 (*)    Alkaline Phosphatase 144 (*)    Total Bilirubin 1.6 (*)    GFR, Estimated 49 (*)    All other components within normal limits  CBC WITH DIFFERENTIAL/PLATELET - Abnormal; Notable for the following components:   Lymphs Abs 0.5 (*)    All other components within normal limits  URINALYSIS, ROUTINE W REFLEX MICROSCOPIC - Abnormal; Notable for the following components:   Glucose, UA 50 (*)    Protein, ur 30 (*)    Bacteria, UA RARE (*)    All other components within normal limits  CULTURE, BLOOD (ROUTINE X 2)   CULTURE, BLOOD (ROUTINE X 2)  URINE CULTURE  PROTIME-INR  APTT  LACTIC ACID, PLASMA    EKG EKG Interpretation  Date/Time:  Monday February 10 2021 01:06:17 EST Ventricular Rate:  133 PR Interval:  170 QRS Duration: 82 QT Interval:  274 QTC Calculation: 408 R Axis:   -58 Text Interpretation: Sinus tachycardia Left anterior fascicular block Rate faster Confirmed by Ezequiel Essex [phone]) on 02/10/2021 1:13:10 AM  Radiology DG Chest Port 1 View  Result Date: 02/10/2021 CLINICAL DATA:  Possible sepsis EXAM: PORTABLE CHEST 1 VIEW COMPARISON:  09/26/2013 FINDINGS: The heart size and mediastinal contours are within normal limits. Both lungs are clear. The visualized skeletal structures are unremarkable. IMPRESSION: No active disease. Electronically Signed   By: Inez Catalina M.D.   On: 02/10/2021 01:05    Procedures .Critical Care Performed by: Ezequiel Essex, MD Authorized by: Ezequiel Essex, MD   Critical care provider statement:    Critical care time (minutes):  45   Critical care was necessary to treat or prevent imminent or life-threatening deterioration of the following conditions:  Sepsis   Critical care was time spent personally by me on the following activities:  Blood draw for specimens, development of treatment plan with patient or surrogate, evaluation  of patient's response to treatment, examination of patient, obtaining history from patient or surrogate, ordering and performing treatments and interventions, ordering and review of laboratory studies, ordering and review of radiographic studies, pulse oximetry, re-evaluation of patient's condition and review of old charts   I assumed direction of critical care for this patient from another provider in my specialty: no     Care discussed with: admitting provider     Medications Ordered in ED Medications  lactated ringers infusion (has no administration in time range)  lactated ringers bolus 1,000 mL (has no  administration in time range)  cefTRIAXone (ROCEPHIN) 2 g in sodium chloride 0.9 % 100 mL IVPB (has no administration in time range)  azithromycin (ZITHROMAX) 500 mg in sodium chloride 0.9 % 250 mL IVPB (has no administration in time range)    ED Course  I have reviewed the triage vital signs and the nursing notes.  Pertinent labs & imaging results that were available during my care of the patient were reviewed by me and considered in my medical decision making (see chart for details).    MDM Rules/Calculators/A&P                          2 days of generalized weakness, cough, congestion, fatigue, chills and fever.  She is tachycardic and febrile on arrival.  Code sepsis activated.  She is given broad-spectrum antibiotics and IV fluids after cultures were obtained  Chest x-ray is negative.  Labs are reassuring.  Lactate is mildly elevated.  HR improved with fluids to 110s.  Flu positive.   Patient still tachycardic and generally weak with difficulty walking and unsteady.   No hypoxia or increased work of breathing.  She still remains tachycardic and dizzy and weak, feel she would benefit from hydration and observation overnight. Patient and family in agreement.  D/w Dr. Myna Hidalgo. Final Clinical Impression(s) / ED Diagnoses Final diagnoses:  Influenza  Generalized weakness  Tachycardia    Rx / DC Orders ED Discharge Orders     None        Donnamaria Shands, Annie Main, MD 02/10/21 [phone]

## 2021-02-10 NOTE — ED Notes (Signed)
Called floor at 1915 to have purple man activated, at 1930 had not been yet, called floor again to give report, no answer on floor

## 2021-02-10 NOTE — ED Notes (Signed)
Daughter Lenise Arena [phone] would like an update and a call when her mom is about to be moved to a different room

## 2021-02-10 NOTE — Sepsis Progress Note (Signed)
Secured Chatted Bedside ED RN who is aware of need for collection of 2nd lactic acid

## 2021-02-10 NOTE — ED Notes (Addendum)
Ambulated PT in room. PT ambulated with unsteady gait, requiring assistance to prevent a fall. PT's O2 saturation remained at 100% during ambulation.

## 2021-02-10 NOTE — ED Triage Notes (Signed)
BIBEMS, cough beginning few days ago, fever and weakness today. Unable to stand on own.  Received 731ml NS with EMS   HR:130 103.69F BP: 132/80 SPO2: 99

## 2021-02-10 NOTE — Sepsis Progress Note (Signed)
Elink following for Sepsis Protocol 

## 2021-02-11 ENCOUNTER — Other Ambulatory Visit (HOSPITAL_COMMUNITY): Payer: Self-pay

## 2021-02-11 DIAGNOSIS — J09X9 Influenza due to identified novel influenza A virus with other manifestations: Secondary | ICD-10-CM | POA: Diagnosis not present

## 2021-02-11 DIAGNOSIS — J101 Influenza due to other identified influenza virus with other respiratory manifestations: Secondary | ICD-10-CM | POA: Diagnosis not present

## 2021-02-11 LAB — CBC
HCT: 37 % (ref 36.0–46.0)
Hemoglobin: 11.5 g/dL — ABNORMAL LOW (ref 12.0–15.0)
MCH: 30.3 pg (ref 26.0–34.0)
MCHC: 31.1 g/dL (ref 30.0–36.0)
MCV: 97.4 fL (ref 80.0–100.0)
Platelets: 170 10*3/uL (ref 150–400)
RBC: 3.8 MIL/uL — ABNORMAL LOW (ref 3.87–5.11)
RDW: 12.3 % (ref 11.5–15.5)
WBC: 5 10*3/uL (ref 4.0–10.5)
nRBC: 0 % (ref 0.0–0.2)

## 2021-02-11 LAB — GLUCOSE, CAPILLARY
Glucose-Capillary: 114 mg/dL — ABNORMAL HIGH (ref 70–99)
Glucose-Capillary: 154 mg/dL — ABNORMAL HIGH (ref 70–99)

## 2021-02-11 LAB — COMPREHENSIVE METABOLIC PANEL
ALT: 52 U/L — ABNORMAL HIGH (ref 0–44)
AST: 49 U/L — ABNORMAL HIGH (ref 15–41)
Albumin: 2.8 g/dL — ABNORMAL LOW (ref 3.5–5.0)
Alkaline Phosphatase: 123 U/L (ref 38–126)
Anion gap: 9 (ref 5–15)
BUN: 7 mg/dL — ABNORMAL LOW (ref 8–23)
CO2: 22 mmol/L (ref 22–32)
Calcium: 8.9 mg/dL (ref 8.9–10.3)
Chloride: 106 mmol/L (ref 98–111)
Creatinine, Ser: 0.87 mg/dL (ref 0.44–1.00)
GFR, Estimated: >60 mL/min (ref >60)
Glucose, Bld: 112 mg/dL — ABNORMAL HIGH (ref 70–99)
Potassium: 3.5 mmol/L (ref 3.5–5.1)
Sodium: 137 mmol/L (ref 135–145)
Total Bilirubin: 1.5 mg/dL — ABNORMAL HIGH (ref 0.3–1.2)
Total Protein: 6.2 g/dL — ABNORMAL LOW (ref 6.5–8.1)

## 2021-02-11 LAB — HEPATITIS PANEL, ACUTE
HCV Ab: NONREACTIVE
Hep A IgM: NONREACTIVE
Hep B C IgM: UNDETERMINED — AB
Hepatitis B Surface Ag: NONREACTIVE

## 2021-02-11 MED ORDER — OSELTAMIVIR PHOSPHATE 30 MG PO CAPS
30.0000 mg | ORAL_CAPSULE | Freq: Two times a day (BID) | ORAL | 0 refills | Status: AC
  Filled 2021-02-11: qty 7, 4d supply, fill #0

## 2021-02-11 NOTE — Discharge Summary (Signed)
Physician Discharge Summary  MABELL ESGUERRA QIW:[id number] DOB: 19-Jun-1940 DOA: 02/10/2021  PCP: Shirline Frees, MD  Admit date: 02/10/2021 Discharge date: 02/11/2021  Admitted From: Home Disposition:  Home  Recommendations for Outpatient Follow-up:  Follow up with PCP in 1-2 weeks   Home Health:NO Equipment/Devices: NONE  Discharge Condition:Stable CODE STATUS:DNR Diet recommendation: Heart Healthy   Brief/Interim Summary:  Patient is a 80 year old with PMH of T2DM, asthma, OSA, HLD who presents with upper respiratory symptoms and is positive for influenza A.  Influenza A - Presents with profound general weakness, loss of appetite, aches, and cough, and is found to have influenza A  - Started on Tamiflu in ED , to finish total 5 days   General weakness  - Presents with profound general weakness, loss of appetite, aches, and cough in setting influenza A  -This has significantly improved she did well with PT today.   Type II DM  -Continue metformin - A1c is 6.5   Elevated LFTs  - Mild elevations in alk phos, transaminases, and bilirubin noted on admission with normal INR  - Check RUQ US -shows increased echogenicity consistent with hepatic steatosis.   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    Activity: As tolerated with Full fall precautions use walker/cane & assistance as needed   Disposition Home    Diet: Heart Healthy   On your next visit with your primary care physician please Get Medicines reviewed and adjusted.   Please request your Prim.MD to go over all Hospital Tests and Procedure/Radiological results at the follow up, please get all Hospital records sent to your Prim MD by signing hospital release before you go home.   If you experience worsening of your admission symptoms, develop shortness of breath, life threatening emergency, suicidal or homicidal thoughts you must seek medical attention immediately by calling 911 or calling your MD immediately  if symptoms less severe.  You Must read complete instructions/literature along with all the possible adverse reactions/side effects for all the Medicines you take and that have been prescribed to you. Take any new Medicines after you have completely understood and accpet all the possible adverse reactions/side effects.   Do not drive, operating heavy machinery, perform activities at heights, swimming or participation in water activities or provide baby sitting services if your were admitted for syncope or siezures until you have seen by Primary MD or a Neurologist and advised to do so again.  Do not drive when taking Pain medications.    Do not take more than prescribed Pain, Sleep and Anxiety Medications  Special  Instructions: If you have smoked or chewed Tobacco  in the last 2 yrs please stop smoking, stop any regular Alcohol  and or any Recreational drug use.  Wear Seat belts while driving.   Please note  You were cared for by a hospitalist during your hospital stay. If you have any questions about your discharge medications or the care you received while you were in the hospital  after you are discharged, you can call the unit and asked to speak with the hospitalist on call if the hospitalist that took care of you is not available. Once you are discharged, your primary care physician will handle any further medical issues. Please note that NO REFILLS for any discharge medications will be authorized once you are discharged, as it is imperative that you return to your primary care physician (or establish a relationship with a primary care physician if you do not have one) for your aftercare needs so that they can reassess your need for medications and monitor your lab values.   Increase activity slowly   Complete by: As directed       Allergies as of 02/11/2021       Reactions   Hydrocodone-acetaminophen    Other reaction(s): insomnia        Medication List     TAKE these medications    albuterol 108 (90 Base) MCG/ACT inhaler Commonly known as: VENTOLIN HFA Inhale 2 puffs into the lungs every 6 (six) hours as needed. For asthma   augmented betamethasone dipropionate 0.05 % cream Commonly known as: DIPROLENE-AF Apply 1 application topically daily.   celecoxib 100 MG capsule Commonly known as: CELEBREX Take 100 mg by mouth daily as needed for mild pain.   cholecalciferol 1000 units tablet Commonly known as: VITAMIN D Take 1,000 Units by mouth daily.   esomeprazole 40 MG capsule Commonly known as: NEXIUM Take 40 mg by mouth daily.   Fluticasone-Salmeterol 250-50 MCG/DOSE Aepb Commonly known as: ADVAIR Inhale 1 puff into the lungs as needed. Pt does not take this scheduled. Takes it as needed for asthma.   FREESTYLE LITE test strip Generic drug: glucose blood U TO CHECK CBG ONCE A DAY   gabapentin 300 MG capsule Commonly known as: NEURONTIN Take 300 mg by mouth 2 (two) times daily as needed (pain).   meclizine 12.5 MG tablet Commonly known as: ANTIVERT Take 12.5 mg by mouth as needed for dizziness.   metFORMIN 500 MG tablet Commonly known as:  GLUCOPHAGE Take 500 mg by mouth daily with breakfast.   metoCLOPramide 10 MG tablet Commonly known as: REGLAN Take 10 mg by mouth 2 (two) times daily before a meal.   oseltamivir 30 MG capsule Commonly known as: TAMIFLU Take 1 capsule (30 mg total) by mouth 2 (two) times daily.   oxybutynin 5 MG tablet Commonly known as: DITROPAN Take 5 mg by mouth 2 (two) times daily as needed for bladder spasms.   pravastatin 40 MG tablet Commonly known as: PRAVACHOL Take 40 mg by mouth daily.   triamcinolone cream 0.1 % Commonly known as: KENALOG Apply 1 application topically daily as needed (rash).        Follow-up Information     Shirline Frees, MD Follow up in 1 week(s).   Specialty: Family Medicine Contact information: [address] [place] [address] [phone]                Allergies  Allergen Reactions   Hydrocodone-Acetaminophen  Other reaction(s): insomnia    Consultations: none   Procedures/Studies: DG Chest Port 1 View  Result Date: 02/10/2021 CLINICAL DATA:  Possible sepsis EXAM: PORTABLE CHEST 1 VIEW COMPARISON:  09/26/2013 FINDINGS: The heart size and mediastinal contours are within normal limits. Both lungs are clear. The visualized skeletal structures are unremarkable. IMPRESSION: No active disease. Electronically Signed   By: Inez Catalina M.D.   On: 02/10/2021 01:05   US Abdomen Limited RUQ (LIVER/GB)  Result Date: 02/10/2021 CLINICAL DATA:  Right upper quadrant pain EXAM: ULTRASOUND ABDOMEN LIMITED RIGHT UPPER QUADRANT COMPARISON:  None. FINDINGS: Gallbladder: No gallstones or wall thickening visualized. No sonographic Murphy sign noted by sonographer. Common bile duct: Diameter: 4 Liver: Increased echogenicity of the parenchyma with no focal mass identified. Portal vein is patent on color Doppler imaging with normal direction of blood flow towards the liver. Other: None. IMPRESSION: Increased echogenicity liver parenchyma which may  represent hepatic steatosis and/or other hepatocellular disease. Electronically Signed   By: Ofilia Neas M.D.   On: 02/10/2021 07:39      Subjective:  No dyspnea, no fever, no chills, she did well with PT. Discharge Exam: Vitals:   02/11/21 0739 02/11/21 0754  BP: (!) 160/68   Pulse: 83   Resp: (!) 22   Temp: 98.5 F (36.9 C)   SpO2: 99% 99%   Vitals:   02/10/21 2323 02/11/21 0400 02/11/21 0739 02/11/21 0754  BP: 116/84 (!) 125/44 (!) 160/68   Pulse: (!) 106 89 83   Resp: 20 (!) 21 (!) 22   Temp: 98.7 F (37.1 C) 98.2 F (36.8 C) 98.5 F (36.9 C)   TempSrc: Oral Oral Oral   SpO2: 96% 98% 99% 99%  Weight:      Height:        General: Pt is alert, awake, not in acute distress Cardiovascular: RRR, S1/S2 +, no rubs, no gallops Respiratory: CTA bilaterally, no wheezing, no rhonchi Abdominal: Soft, NT, ND, bowel sounds + Extremities: no edema, no cyanosis    The results of significant diagnostics from this hospitalization (including imaging, microbiology, ancillary and laboratory) are listed below for reference.     Microbiology: Recent Results (from the past 240 hour(s))  Resp Panel by RT-PCR (Flu A&B, Covid) Nasopharyngeal Swab     Status: Abnormal   Collection Time: 02/10/21 12:49 AM   Specimen: Nasopharyngeal Swab; Nasopharyngeal(NP) swabs in vial transport medium  Result Value Ref Range Status   SARS Coronavirus 2 by RT PCR NEGATIVE NEGATIVE Final    Comment: (NOTE) SARS-CoV-2 target nucleic acids are NOT DETECTED.  The SARS-CoV-2 RNA is generally detectable in upper respiratory specimens during the acute phase of infection. The lowest concentration of SARS-CoV-2 viral copies this assay can detect is 138 copies/mL. A negative result does not preclude SARS-Cov-2 infection and should not be used as the sole basis for treatment or other patient management decisions. A negative result may occur with  improper specimen collection/handling, submission of  specimen other than nasopharyngeal swab, presence of viral mutation(s) within the areas targeted by this assay, and inadequate number of viral copies(<138 copies/mL). A negative result must be combined with clinical observations, patient history, and epidemiological information. The expected result is Negative.  Fact Sheet for Patients:  EntrepreneurPulse.com.au  Fact Sheet for Healthcare Providers:  IncredibleEmployment.be  This test is no t yet approved or cleared by the [place] FDA and  has been authorized for detection and/or diagnosis of SARS-CoV-2 by FDA under an Emergency Use Authorization (  EUA). This EUA will remain  in effect (meaning this test can be used) for the duration of the COVID-19 declaration under Section 564(b)(1) of the Act, 21 U.S.C.section 360bbb-3(b)(1), unless the authorization is terminated  or revoked sooner.       Influenza A by PCR POSITIVE (A) NEGATIVE Final   Influenza B by PCR NEGATIVE NEGATIVE Final    Comment: (NOTE) The Xpert Xpress SARS-CoV-2/FLU/RSV plus assay is intended as an aid in the diagnosis of influenza from Nasopharyngeal swab specimens and should not be used as a sole basis for treatment. Nasal washings and aspirates are unacceptable for Xpert Xpress SARS-CoV-2/FLU/RSV testing.  Fact Sheet for Patients: EntrepreneurPulse.com.au  Fact Sheet for Healthcare Providers: IncredibleEmployment.be  This test is not yet approved or cleared by the [place] FDA and has been authorized for detection and/or diagnosis of SARS-CoV-2 by FDA under an Emergency Use Authorization (EUA). This EUA will remain in effect (meaning this test can be used) for the duration of the COVID-19 declaration under Section 564(b)(1) of the Act, 21 U.S.C. section 360bbb-3(b)(1), unless the authorization is terminated or revoked.  Performed at [facility], [place] [address], [place], [place] [address]   Urine Culture     Status: Abnormal   Collection Time: 02/10/21 12:49 AM   Specimen: In/Out Cath Urine  Result Value Ref Range Status   Specimen Description IN/OUT CATH URINE  Final   Special Requests   Final    NONE Performed at [facility], [place] [address], [place], [place] [address]    Culture MULTIPLE SPECIES PRESENT, SUGGEST RECOLLECTION (A)  Final   Report Status 02/10/2021 FINAL  Final  Blood Culture (routine x 2)     Status: None (Preliminary result)   Collection Time: 02/10/21  1:02 AM   Specimen: BLOOD  Result Value Ref Range Status   Specimen Description BLOOD RIGHT ANTECUBITAL  Final   Special Requests   Final    BOTTLES DRAWN AEROBIC AND ANAEROBIC Blood Culture adequate volume   Culture   Final    NO GROWTH 1 DAY Performed at [facility], [place] [address] [place], [place] [address]    Report Status PENDING  Incomplete  Blood Culture (routine x 2)     Status: None (Preliminary result)   Collection Time: 02/10/21  8:00 AM   Specimen: BLOOD LEFT FOREARM  Result Value Ref Range Status   Specimen Description BLOOD LEFT FOREARM  Final   Special Requests   Final    BOTTLES DRAWN AEROBIC AND ANAEROBIC Blood Culture adequate volume   Culture   Final    NO GROWTH 1 DAY Performed at [facility], [place] [address], [place], [place] [address]    Report Status PENDING  Incomplete     Labs: BNP (last 3 results) No results for input(s): BNP in the last 8760 hours. Basic Metabolic Panel: Recent Labs  Lab 02/10/21 0101 02/11/21 0059  NA 137 137  K 3.9 3.5  CL 105 106  CO2 23 22  GLUCOSE 186* 112*  BUN 14 7*  CREATININE 1.13* 0.87  CALCIUM 9.3 8.9   Liver Function Tests: Recent Labs  Lab 02/10/21 0101 02/11/21 0059  AST 65* 49*  ALT 73* 52*  ALKPHOS 144* 123  BILITOT 1.6* 1.5*  PROT 7.3 6.2*  ALBUMIN 3.5 2.8*   No results for input(s): LIPASE, AMYLASE in the last 168 hours. No results for input(s): AMMONIA in  the last 168 hours.  CBC: Recent Labs  Lab 02/10/21 0101 02/11/21 0059  WBC 8.2 5.0  NEUTROABS 7.3  --   HGB 12.8 11.5*  HCT 41.1 37.0  MCV 96.9 97.4  PLT 213 170   Cardiac Enzymes: Recent Labs  Lab 02/10/21 0800  CKTOTAL 448*   BNP: Invalid input(s): POCBNP CBG: Recent Labs  Lab 02/10/21 0809 02/10/21 1402 02/10/21 1617 02/10/21 2150 02/11/21 0742  GLUCAP 136* 161* 125* 122* 114*   D-Dimer No results for input(s): DDIMER in the last 72 hours. Hgb A1c Recent Labs    02/10/21 0800  HGBA1C 6.5*   Lipid Profile No results for input(s): CHOL, HDL, LDLCALC, TRIG, CHOLHDL, LDLDIRECT in the last 72 hours. Thyroid function studies No results for input(s): TSH, T4TOTAL, T3FREE, THYROIDAB in the last 72 hours.  Invalid input(s): FREET3 Anemia work up No results for input(s): VITAMINB12, FOLATE, FERRITIN, TIBC, IRON, RETICCTPCT in the last 72 hours. Urinalysis    Component Value Date/Time   COLORURINE YELLOW 02/10/2021 0252   APPEARANCEUR CLEAR 02/10/2021 0252   LABSPEC 1.009 02/10/2021 0252   PHURINE 7.0 02/10/2021 0252   GLUCOSEU 50 (A) 02/10/2021 0252   HGBUR NEGATIVE 02/10/2021 0252   BILIRUBINUR NEGATIVE 02/10/2021 0252   BILIRUBINUR Negative 10/19/2011 1713   KETONESUR NEGATIVE 02/10/2021 0252   PROTEINUR 30 (A) 02/10/2021 0252   UROBILINOGEN 1.0 10/19/2011 1713   NITRITE NEGATIVE 02/10/2021 0252   LEUKOCYTESUR NEGATIVE 02/10/2021 0252   Sepsis Labs Invalid input(s): PROCALCITONIN,  WBC,  LACTICIDVEN Microbiology Recent Results (from the past 240 hour(s))  Resp Panel by RT-PCR (Flu A&B, Covid) Nasopharyngeal Swab     Status: Abnormal   Collection Time: 02/10/21 12:49 AM   Specimen: Nasopharyngeal Swab; Nasopharyngeal(NP) swabs in vial transport medium  Result Value Ref Range Status   SARS Coronavirus 2 by RT PCR NEGATIVE NEGATIVE Final    Comment: (NOTE) SARS-CoV-2 target nucleic acids are NOT DETECTED.  The SARS-CoV-2 RNA is generally detectable  in upper respiratory specimens during the acute phase of infection. The lowest concentration of SARS-CoV-2 viral copies this assay can detect is 138 copies/mL. A negative result does not preclude SARS-Cov-2 infection and should not be used as the sole basis for treatment or other patient management decisions. A negative result may occur with  improper specimen collection/handling, submission of specimen other than nasopharyngeal swab, presence of viral mutation(s) within the areas targeted by this assay, and inadequate number of viral copies(<138 copies/mL). A negative result must be combined with clinical observations, patient history, and epidemiological information. The expected result is Negative.  Fact Sheet for Patients:  EntrepreneurPulse.com.au  Fact Sheet for Healthcare Providers:  IncredibleEmployment.be  This test is no t yet approved or cleared by the [place] FDA and  has been authorized for detection and/or diagnosis of SARS-CoV-2 by FDA under an Emergency Use Authorization (EUA). This EUA will remain  in effect (meaning this test can be used) for the duration of the COVID-19 declaration under Section 564(b)(1) of the Act, 21 U.S.C.section 360bbb-3(b)(1), unless the authorization is terminated  or revoked sooner.       Influenza A by PCR POSITIVE (A) NEGATIVE Final   Influenza B by PCR NEGATIVE NEGATIVE Final    Comment: (NOTE) The Xpert Xpress SARS-CoV-2/FLU/RSV plus assay is intended as an aid in the diagnosis of influenza from Nasopharyngeal swab specimens and should not be used as a sole basis for treatment. Nasal washings and aspirates are unacceptable for Xpert Xpress SARS-CoV-2/FLU/RSV testing.  Fact Sheet for Patients: EntrepreneurPulse.com.au  Fact Sheet for Healthcare Providers: IncredibleEmployment.be  This test is not yet approved or cleared by the [place] FDA  and has been authorized for detection and/or diagnosis of SARS-CoV-2 by FDA under an Emergency Use Authorization (EUA). This EUA will remain in effect (meaning this test can be used) for the duration of the COVID-19 declaration under Section 564(b)(1) of the Act, 21 U.S.C. section 360bbb-3(b)(1), unless the authorization is terminated or revoked.  Performed at [facility], [place] [address] [place], [place] [address]   Urine Culture     Status: Abnormal   Collection Time: 02/10/21 12:49 AM   Specimen: In/Out Cath Urine  Result Value Ref Range Status   Specimen Description IN/OUT CATH URINE  Final   Special Requests   Final    NONE Performed at [facility], [place] [address] [place], [place] [address]    Culture MULTIPLE SPECIES PRESENT, SUGGEST RECOLLECTION (A)  Final   Report Status 02/10/2021 FINAL  Final  Blood Culture (routine x 2)     Status: None (Preliminary result)   Collection Time: 02/10/21  1:02 AM   Specimen: BLOOD  Result Value Ref Range Status   Specimen Description BLOOD RIGHT ANTECUBITAL  Final   Special Requests   Final    BOTTLES DRAWN AEROBIC AND ANAEROBIC Blood Culture adequate volume   Culture   Final    NO GROWTH 1 DAY Performed at [facility], [place] [address], [place], [place] [address]    Report Status PENDING  Incomplete  Blood Culture (routine x 2)     Status: None (Preliminary result)   Collection Time: 02/10/21  8:00 AM   Specimen: BLOOD LEFT FOREARM  Result Value Ref Range Status   Specimen Description BLOOD LEFT FOREARM  Final   Special Requests   Final    BOTTLES DRAWN AEROBIC AND ANAEROBIC Blood Culture adequate volume   Culture   Final    NO GROWTH 1 DAY Performed at [facility], [place] [address], [place], [place] [address]    Report Status PENDING  Incomplete     Time coordinating discharge:  30 minutes  SIGNED:   Phillips Climes, MD  [facility] 02/11/2021, 12:24 PM Pager   If 7PM-7AM, please  contact night-coverage [email or web address] Password [email or web address]

## 2021-02-11 NOTE — [place] Notes (Signed)
Patient educated on discharge instructions-( New medication regimen and how to take, any follow up appointments, etc).  Personal belongings gathered and placed in a personal belongings bag. IV removed without any complaints of pain or discomfort. Family at bedside and will transfer patient home. Transport paged to take patient to front via wheelchair. No questions or complaints mentioned at this time.

## 2021-02-11 NOTE — Care Management CC44 (Signed)
Condition Code 44 Documentation Completed  Patient Details  Name: SHANDRA SZYMBORSKI MRN: [id number] Date of Birth: 29-Dec-1940   Condition Code 44 given:  Yes Patient signature on Condition Code 44 notice:  Yes Documentation of 2 MD's agreement:  Yes Code 44 added to claim:  Yes    Angelita Ingles, RN 02/11/2021, 12:45 PM

## 2021-02-11 NOTE — [place] Notes (Signed)
[facility] paged informed that cardiac monitoring order has expired, Arthor Captain LPN

## 2021-02-11 NOTE — Evaluation (Signed)
Physical Therapy Evaluation and Discharge Patient Details Name: Penny Hernandez MRN: [id number] DOB: 13-Apr-1940 Today's Date: 02/11/2021  History of Present Illness  80 y.o. female presenting to the emergency department 02/10/21 for evaluation of aches, loss of appetite, cough, sore throat, fatigue, and general weakness. Influenza a is positive.  PMHx- significant for type 2 diabetes mellitus, asthma, sleep apnea, and hyperlipidemia  Clinical Impression   Patient evaluated by Physical Therapy with no further acute PT needs identified.  PT is signing off. Thank you for this referral.        Recommendations for follow up therapy are one component of a multi-disciplinary discharge planning process, led by the attending physician.  Recommendations may be updated based on patient status, additional functional criteria and insurance authorization.  Follow Up Recommendations No PT follow up    Assistance Recommended at Discharge PRN (as PTA)  Functional Status Assessment Patient has not had a recent decline in their functional status  Equipment Recommendations  None recommended by PT    Recommendations for Other Services       Precautions / Restrictions Precautions Precautions: None      Mobility  Bed Mobility                    Transfers Overall transfer level: Independent Equipment used: None                    Ambulation/Gait Ambulation/Gait assistance: Independent Gait Distance (Feet): 100 Feet Assistive device: None Gait Pattern/deviations: WFL(Within Functional Limits)   Gait velocity interpretation: 1.31 - 2.62 ft/sec, indicative of limited community ambulator   General Gait Details: no imbalance; turning head, maneuvering around objects  Stairs            Wheelchair Mobility    Modified Rankin (Stroke Patients Only)       Balance Overall balance assessment: Independent                               Standardized  Balance Assessment Standardized Balance Assessment : Berg Balance Test Berg Balance Test Sit to Stand: Able to stand without using hands and stabilize independently Standing Unsupported: Able to stand safely 2 minutes Sitting with Back Unsupported but Feet Supported on Floor or Stool: Able to sit safely and securely 2 minutes Stand to Sit: Sits safely with minimal use of hands Transfers: Able to transfer safely, minor use of hands Standing Unsupported with Eyes Closed: Able to stand 10 seconds safely Standing Ubsupported with Feet Together: Able to place feet together independently and stand 1 minute safely From Standing, Reach Forward with Outstretched Arm: Can reach confidently >25 cm (10") From Standing Position, Pick up Object from Floor: Able to pick up shoe safely and easily From Standing Position, Turn to Look Behind Over each Shoulder: Looks behind from both sides and weight shifts well Turn 360 Degrees: Able to turn 360 degrees safely in 4 seconds or less Standing Unsupported, Alternately Place Feet on Step/Stool: Able to stand independently and safely and complete 8 steps in 20 seconds Standing Unsupported, One Foot in Front: Able to plae foot ahead of the other independently and hold 30 seconds Standing on One Leg: Able to lift leg independently and hold equal to or more than 3 seconds Total Score: 53         Pertinent Vitals/Pain Pain Assessment: No/denies pain    Home Living Family/patient expects to be discharged to::  Private residence Living Arrangements: Children (daughter) Available Help at Discharge: Available PRN/intermittently             Home Equipment: None      Prior Function Prior Level of Function : Independent/Modified Independent;Driving                     Hand Dominance        Extremity/Trunk Assessment   Upper Extremity Assessment Upper Extremity Assessment: Overall WFL for tasks assessed    Lower Extremity Assessment Lower  Extremity Assessment: Overall WFL for tasks assessed    Cervical / Trunk Assessment Cervical / Trunk Assessment: Normal  Communication   Communication: HOH (hearing aid)  Cognition Arousal/Alertness: Awake/alert Behavior During Therapy: WFL for tasks assessed/performed Overall Cognitive Status: Within Functional Limits for tasks assessed                                          General Comments General comments (skin integrity, edema, etc.): patient reports fall the night she came to the hospital; not sure what happened other than she woke up and when she got up to walk she fell; was not able to get off the floor and dtr called EMS    Exercises     Assessment/Plan    PT Assessment Patient does not need any further PT services  PT Problem List         PT Treatment Interventions      PT Goals (Current goals can be found in the Care Plan section)  Acute Rehab PT Goals Patient Stated Goal: feel better PT Goal Formulation: All assessment and education complete, DC therapy    Frequency     Barriers to discharge        Co-evaluation               AM-PAC PT "6 Clicks" Mobility  Outcome Measure Help needed turning from your back to your side while in a flat bed without using bedrails?: None Help needed moving from lying on your back to sitting on the side of a flat bed without using bedrails?: None Help needed moving to and from a bed to a chair (including a wheelchair)?: None Help needed standing up from a chair using your arms (e.g., wheelchair or bedside chair)?: None Help needed to walk in hospital room?: None Help needed climbing 3-5 steps with a railing? : None 6 Click Score: 24    End of Session   Activity Tolerance: Patient tolerated treatment well Patient left: in chair;with call bell/phone within reach;with chair alarm set   PT Visit Diagnosis: History of falling (Z91.81)    Time: 2122-4825 PT Time Calculation (min) (ACUTE ONLY): 14  min   Charges:   PT Evaluation $PT Eval Low Complexity: 1 Low           Arby Barrette, PT Acute Rehabilitation Services  Pager [phone] Office [phone]   Rexanne Mano 02/11/2021, 11:33 AM

## 2021-02-11 NOTE — Plan of Care (Signed)

## 2021-02-11 NOTE — Discharge Instructions (Signed)
Follow with Primary MD Harris, William, MD in 7 days   Get CBC, CMP, checked  by Primary MD next visit.    Activity: As tolerated with Full fall precautions use walker/cane & assistance as needed   Disposition Home    Diet: Heart Healthy   On your next visit with your primary care physician please Get Medicines reviewed and adjusted.   Please request your Prim.MD to go over all Hospital Tests and Procedure/Radiological results at the follow up, please get all Hospital records sent to your Prim MD by signing hospital release before you go home.   If you experience worsening of your admission symptoms, develop shortness of breath, life threatening emergency, suicidal or homicidal thoughts you must seek medical attention immediately by calling 911 or calling your MD immediately  if symptoms less severe.  You Must read complete instructions/literature along with all the possible adverse reactions/side effects for all the Medicines you take and that have been prescribed to you. Take any new Medicines after you have completely understood and accpet all the possible adverse reactions/side effects.   Do not drive, operating heavy machinery, perform activities at heights, swimming or participation in water activities or provide baby sitting services if your were admitted for syncope or siezures until you have seen by Primary MD or a Neurologist and advised to do so again.  Do not drive when taking Pain medications.    Do not take more than prescribed Pain, Sleep and Anxiety Medications  Special Instructions: If you have smoked or chewed Tobacco  in the last 2 yrs please stop smoking, stop any regular Alcohol  and or any Recreational drug use.  Wear Seat belts while driving.   Please note  You were cared for by a hospitalist during your hospital stay. If you have any questions about your discharge medications or the care you received while you were in the hospital after you are discharged,  you can call the unit and asked to speak with the hospitalist on call if the hospitalist that took care of you is not available. Once you are discharged, your primary care physician will handle any further medical issues. Please note that NO REFILLS for any discharge medications will be authorized once you are discharged, as it is imperative that you return to your primary care physician (or establish a relationship with a primary care physician if you do not have one) for your aftercare needs so that they can reassess your need for medications and monitor your lab values. 

## 2021-02-11 NOTE — Care Management Obs Status (Signed)
[place] NOTIFICATION   Patient Details  Name: Penny Hernandez MRN: [id number] Date of Birth: [birth year]   Medicare Observation Status Notification Given:  Yes    Angelita Ingles, RN 02/11/2021, 12:45 PM

## 2021-02-15 LAB — CULTURE, BLOOD (ROUTINE X 2)
Culture: NO GROWTH
Culture: NO GROWTH
Special Requests: ADEQUATE
Special Requests: ADEQUATE

## 2021-02-24 DIAGNOSIS — R5381 Other malaise: Secondary | ICD-10-CM | POA: Diagnosis not present

## 2021-02-24 DIAGNOSIS — R399 Unspecified symptoms and signs involving the genitourinary system: Secondary | ICD-10-CM | POA: Diagnosis not present

## 2021-02-24 DIAGNOSIS — Z8709 Personal history of other diseases of the respiratory system: Secondary | ICD-10-CM | POA: Diagnosis not present

## 2021-02-24 DIAGNOSIS — J4520 Mild intermittent asthma, uncomplicated: Secondary | ICD-10-CM | POA: Diagnosis not present

## 2021-04-02 ENCOUNTER — Ambulatory Visit (INDEPENDENT_AMBULATORY_CARE_PROVIDER_SITE_OTHER): Payer: Medicare Other | Admitting: Podiatry

## 2021-04-02 ENCOUNTER — Encounter: Payer: Self-pay | Admitting: Podiatry

## 2021-04-02 ENCOUNTER — Other Ambulatory Visit: Payer: Self-pay

## 2021-04-02 DIAGNOSIS — E119 Type 2 diabetes mellitus without complications: Secondary | ICD-10-CM

## 2021-04-02 DIAGNOSIS — M79675 Pain in left toe(s): Secondary | ICD-10-CM

## 2021-04-02 DIAGNOSIS — B351 Tinea unguium: Secondary | ICD-10-CM

## 2021-04-02 DIAGNOSIS — M79674 Pain in right toe(s): Secondary | ICD-10-CM | POA: Diagnosis not present

## 2021-04-02 DIAGNOSIS — M129 Arthropathy, unspecified: Secondary | ICD-10-CM | POA: Diagnosis not present

## 2021-04-02 NOTE — Progress Notes (Signed)
This patient returns to my office for at risk foot care.  This patient requires this care by a professional since this patient will be at risk due to having  Diabetes mellitus.  This patient is unable to cut nails herself since the patient cannot reach her nails.These nails are painful walking and wearing shoes.  This patient presents for at risk foot care today.  General Appearance  Alert, conversant and in no acute stress.  Vascular  Dorsalis pedis and posterior tibial  pulses are palpable  bilaterally.  Capillary return is within normal limits  bilaterally. Temperature is within normal limits  bilaterally.  Neurologic  Senn-Weinstein monofilament wire test within normal limits  bilaterally. Muscle power within normal limits bilaterally.  Nails Thick disfigured discolored nails with subungual debris  from hallux to fifth toes bilaterally. No evidence of bacterial infection or drainage bilaterally.  Orthopedic  No limitations of motion  feet .  No crepitus or effusions noted.  No bony pathology or digital deformities noted.  Midfoot arthritis  B/L  Skin  normotropic skin with no porokeratosis noted bilaterally.  No signs of infections or ulcers noted.     Onychomycosis  Pain in right toes  Pain in left toes  Chronic arthritis  B/L.  Consent was obtained for treatment procedures.   Mechanical debridement of nails 1-5  bilaterally performed with a nail nipper.  Filed with dremel without incident.    Return office visit     3 months                 Told patient to return for periodic foot care and evaluation due to potential at risk complications.   Aubrynn Katona DPM  

## 2021-04-28 DIAGNOSIS — E1142 Type 2 diabetes mellitus with diabetic polyneuropathy: Secondary | ICD-10-CM | POA: Diagnosis not present

## 2021-04-28 DIAGNOSIS — J449 Chronic obstructive pulmonary disease, unspecified: Secondary | ICD-10-CM | POA: Diagnosis not present

## 2021-04-28 DIAGNOSIS — H35033 Hypertensive retinopathy, bilateral: Secondary | ICD-10-CM | POA: Diagnosis not present

## 2021-04-28 DIAGNOSIS — J4520 Mild intermittent asthma, uncomplicated: Secondary | ICD-10-CM | POA: Diagnosis not present

## 2021-04-28 DIAGNOSIS — M1990 Unspecified osteoarthritis, unspecified site: Secondary | ICD-10-CM | POA: Diagnosis not present

## 2021-04-28 DIAGNOSIS — K219 Gastro-esophageal reflux disease without esophagitis: Secondary | ICD-10-CM | POA: Diagnosis not present

## 2021-04-28 DIAGNOSIS — E7800 Pure hypercholesterolemia, unspecified: Secondary | ICD-10-CM | POA: Diagnosis not present

## 2021-05-23 DIAGNOSIS — G4733 Obstructive sleep apnea (adult) (pediatric): Secondary | ICD-10-CM | POA: Diagnosis not present

## 2021-05-23 DIAGNOSIS — E1142 Type 2 diabetes mellitus with diabetic polyneuropathy: Secondary | ICD-10-CM | POA: Diagnosis not present

## 2021-05-23 DIAGNOSIS — R1013 Epigastric pain: Secondary | ICD-10-CM | POA: Diagnosis not present

## 2021-05-23 DIAGNOSIS — N3281 Overactive bladder: Secondary | ICD-10-CM | POA: Diagnosis not present

## 2021-05-23 DIAGNOSIS — R3915 Urgency of urination: Secondary | ICD-10-CM | POA: Diagnosis not present

## 2021-05-23 DIAGNOSIS — J309 Allergic rhinitis, unspecified: Secondary | ICD-10-CM | POA: Diagnosis not present

## 2021-05-23 DIAGNOSIS — Z0000 Encounter for general adult medical examination without abnormal findings: Secondary | ICD-10-CM | POA: Diagnosis not present

## 2021-05-23 DIAGNOSIS — E7800 Pure hypercholesterolemia, unspecified: Secondary | ICD-10-CM | POA: Diagnosis not present

## 2021-06-26 DIAGNOSIS — Z20822 Contact with and (suspected) exposure to covid-19: Secondary | ICD-10-CM | POA: Diagnosis not present

## 2021-07-04 ENCOUNTER — Encounter: Payer: Self-pay | Admitting: Podiatry

## 2021-07-04 ENCOUNTER — Ambulatory Visit (INDEPENDENT_AMBULATORY_CARE_PROVIDER_SITE_OTHER): Payer: Medicare Other | Admitting: Podiatry

## 2021-07-04 DIAGNOSIS — B351 Tinea unguium: Secondary | ICD-10-CM

## 2021-07-04 DIAGNOSIS — E119 Type 2 diabetes mellitus without complications: Secondary | ICD-10-CM

## 2021-07-04 DIAGNOSIS — M79674 Pain in right toe(s): Secondary | ICD-10-CM | POA: Diagnosis not present

## 2021-07-04 DIAGNOSIS — M129 Arthropathy, unspecified: Secondary | ICD-10-CM

## 2021-07-04 DIAGNOSIS — M79675 Pain in left toe(s): Secondary | ICD-10-CM | POA: Diagnosis not present

## 2021-07-04 NOTE — Progress Notes (Signed)
This patient returns to my office for at risk foot care.  This patient requires this care by a professional since this patient will be at risk due to having  Diabetes mellitus.  This patient is unable to cut nails herself since the patient cannot reach her nails.These nails are painful walking and wearing shoes.  This patient presents for at risk foot care today. ? ?General Appearance  Alert, conversant and in no acute stress. ? ?Vascular  Dorsalis pedis and posterior tibial  pulses are palpable  bilaterally.  Capillary return is within normal limits  bilaterally. Temperature is within normal limits  bilaterally. ? ?Neurologic  Senn-Weinstein monofilament wire test within normal limits  bilaterally. Muscle power within normal limits bilaterally. ? ?Nails Thick disfigured discolored nails with subungual debris  from hallux to fifth toes bilaterally. No evidence of bacterial infection or drainage bilaterally. ? ?Orthopedic  No limitations of motion  feet .  No crepitus or effusions noted.  No bony pathology or digital deformities noted.  Midfoot arthritis  B/L ? ?Skin  normotropic skin with no porokeratosis noted bilaterally.  No signs of infections or ulcers noted.    ? ?Onychomycosis  Pain in right toes  Pain in left toes  Chronic arthritis  B/L. ? ?Consent was obtained for treatment procedures.   Mechanical debridement of nails 1-5  bilaterally performed with a nail nipper.  Filed with dremel without incident. Discussed possible future nail surgery right hallux. ? ? ?Return office visit     3 months                 Told patient to return for periodic foot care and evaluation due to potential at risk complications. ? ? ?Gardiner Barefoot DPM  ?

## 2021-07-07 DIAGNOSIS — E1142 Type 2 diabetes mellitus with diabetic polyneuropathy: Secondary | ICD-10-CM | POA: Diagnosis not present

## 2021-07-07 DIAGNOSIS — G4733 Obstructive sleep apnea (adult) (pediatric): Secondary | ICD-10-CM | POA: Diagnosis not present

## 2021-07-07 DIAGNOSIS — E669 Obesity, unspecified: Secondary | ICD-10-CM | POA: Diagnosis not present

## 2021-07-07 DIAGNOSIS — J449 Chronic obstructive pulmonary disease, unspecified: Secondary | ICD-10-CM | POA: Diagnosis not present

## 2021-07-07 DIAGNOSIS — R030 Elevated blood-pressure reading, without diagnosis of hypertension: Secondary | ICD-10-CM | POA: Diagnosis not present

## 2021-07-24 DIAGNOSIS — Z20822 Contact with and (suspected) exposure to covid-19: Secondary | ICD-10-CM | POA: Diagnosis not present

## 2021-08-04 DIAGNOSIS — E1142 Type 2 diabetes mellitus with diabetic polyneuropathy: Secondary | ICD-10-CM | POA: Diagnosis not present

## 2021-08-04 DIAGNOSIS — E669 Obesity, unspecified: Secondary | ICD-10-CM | POA: Diagnosis not present

## 2021-08-04 DIAGNOSIS — R030 Elevated blood-pressure reading, without diagnosis of hypertension: Secondary | ICD-10-CM | POA: Diagnosis not present

## 2021-08-04 DIAGNOSIS — J449 Chronic obstructive pulmonary disease, unspecified: Secondary | ICD-10-CM | POA: Diagnosis not present

## 2021-08-04 DIAGNOSIS — G4733 Obstructive sleep apnea (adult) (pediatric): Secondary | ICD-10-CM | POA: Diagnosis not present

## 2021-08-05 DIAGNOSIS — J449 Chronic obstructive pulmonary disease, unspecified: Secondary | ICD-10-CM | POA: Diagnosis not present

## 2021-08-05 DIAGNOSIS — K219 Gastro-esophageal reflux disease without esophagitis: Secondary | ICD-10-CM | POA: Diagnosis not present

## 2021-08-05 DIAGNOSIS — E1142 Type 2 diabetes mellitus with diabetic polyneuropathy: Secondary | ICD-10-CM | POA: Diagnosis not present

## 2021-08-05 DIAGNOSIS — J4520 Mild intermittent asthma, uncomplicated: Secondary | ICD-10-CM | POA: Diagnosis not present

## 2021-08-05 DIAGNOSIS — E7800 Pure hypercholesterolemia, unspecified: Secondary | ICD-10-CM | POA: Diagnosis not present

## 2021-08-06 DIAGNOSIS — Z20822 Contact with and (suspected) exposure to covid-19: Secondary | ICD-10-CM | POA: Diagnosis not present

## 2021-08-09 DIAGNOSIS — Z20822 Contact with and (suspected) exposure to covid-19: Secondary | ICD-10-CM | POA: Diagnosis not present

## 2021-08-28 DIAGNOSIS — Z1231 Encounter for screening mammogram for malignant neoplasm of breast: Secondary | ICD-10-CM | POA: Diagnosis not present

## 2021-10-08 ENCOUNTER — Encounter: Payer: Self-pay | Admitting: Podiatry

## 2021-10-08 ENCOUNTER — Ambulatory Visit (INDEPENDENT_AMBULATORY_CARE_PROVIDER_SITE_OTHER): Payer: Medicare Other | Admitting: Podiatry

## 2021-10-08 DIAGNOSIS — M79675 Pain in left toe(s): Secondary | ICD-10-CM

## 2021-10-08 DIAGNOSIS — B351 Tinea unguium: Secondary | ICD-10-CM

## 2021-10-08 DIAGNOSIS — M79674 Pain in right toe(s): Secondary | ICD-10-CM

## 2021-10-08 DIAGNOSIS — E119 Type 2 diabetes mellitus without complications: Secondary | ICD-10-CM | POA: Diagnosis not present

## 2021-10-08 DIAGNOSIS — M129 Arthropathy, unspecified: Secondary | ICD-10-CM

## 2021-10-08 NOTE — Progress Notes (Signed)
This patient returns to my office for at risk foot care.  This patient requires this care by a professional since this patient will be at risk due to having  Diabetes mellitus.  This patient is unable to cut nails herself since the patient cannot reach her nails.These nails are painful walking and wearing shoes.  This patient presents for at risk foot care today.  General Appearance  Alert, conversant and in no acute stress.  Vascular  Dorsalis pedis and posterior tibial  pulses are palpable  bilaterally.  Capillary return is within normal limits  bilaterally. Temperature is within normal limits  bilaterally.  Neurologic  Senn-Weinstein monofilament wire test within normal limits  bilaterally. Muscle power within normal limits bilaterally.  Nails Thick disfigured discolored nails with subungual debris  from hallux to fifth toes bilaterally. No evidence of bacterial infection or drainage bilaterally.  Orthopedic  No limitations of motion  feet .  No crepitus or effusions noted.  No bony pathology or digital deformities noted.  Midfoot arthritis  B/L  Skin  normotropic skin with no porokeratosis noted bilaterally.  No signs of infections or ulcers noted.     Onychomycosis  Pain in right toes  Pain in left toes  Chronic arthritis  B/L.  Consent was obtained for treatment procedures.   Mechanical debridement of nails 1-5  bilaterally performed with a nail nipper.  Filed with dremel without incident.    Return office visit     3 months                 Told patient to return for periodic foot care and evaluation due to potential at risk complications.   Suhaas Agena DPM  

## 2021-10-09 DIAGNOSIS — E7800 Pure hypercholesterolemia, unspecified: Secondary | ICD-10-CM | POA: Diagnosis not present

## 2021-10-09 DIAGNOSIS — J449 Chronic obstructive pulmonary disease, unspecified: Secondary | ICD-10-CM | POA: Diagnosis not present

## 2021-10-09 DIAGNOSIS — J4520 Mild intermittent asthma, uncomplicated: Secondary | ICD-10-CM | POA: Diagnosis not present

## 2021-10-09 DIAGNOSIS — E1142 Type 2 diabetes mellitus with diabetic polyneuropathy: Secondary | ICD-10-CM | POA: Diagnosis not present

## 2021-10-09 DIAGNOSIS — K219 Gastro-esophageal reflux disease without esophagitis: Secondary | ICD-10-CM | POA: Diagnosis not present

## 2021-10-23 DIAGNOSIS — L853 Xerosis cutis: Secondary | ICD-10-CM | POA: Diagnosis not present

## 2021-10-23 DIAGNOSIS — B353 Tinea pedis: Secondary | ICD-10-CM | POA: Diagnosis not present

## 2021-11-06 DIAGNOSIS — J449 Chronic obstructive pulmonary disease, unspecified: Secondary | ICD-10-CM | POA: Diagnosis not present

## 2021-11-06 DIAGNOSIS — E669 Obesity, unspecified: Secondary | ICD-10-CM | POA: Diagnosis not present

## 2021-11-06 DIAGNOSIS — G4733 Obstructive sleep apnea (adult) (pediatric): Secondary | ICD-10-CM | POA: Diagnosis not present

## 2021-11-06 DIAGNOSIS — E1142 Type 2 diabetes mellitus with diabetic polyneuropathy: Secondary | ICD-10-CM | POA: Diagnosis not present

## 2021-11-27 DIAGNOSIS — N3281 Overactive bladder: Secondary | ICD-10-CM | POA: Diagnosis not present

## 2021-11-27 DIAGNOSIS — G4730 Sleep apnea, unspecified: Secondary | ICD-10-CM | POA: Diagnosis not present

## 2021-11-27 DIAGNOSIS — E1142 Type 2 diabetes mellitus with diabetic polyneuropathy: Secondary | ICD-10-CM | POA: Diagnosis not present

## 2021-11-27 DIAGNOSIS — E559 Vitamin D deficiency, unspecified: Secondary | ICD-10-CM | POA: Diagnosis not present

## 2021-11-27 DIAGNOSIS — J449 Chronic obstructive pulmonary disease, unspecified: Secondary | ICD-10-CM | POA: Diagnosis not present

## 2021-11-27 DIAGNOSIS — M549 Dorsalgia, unspecified: Secondary | ICD-10-CM | POA: Diagnosis not present

## 2021-11-27 DIAGNOSIS — K219 Gastro-esophageal reflux disease without esophagitis: Secondary | ICD-10-CM | POA: Diagnosis not present

## 2021-11-27 DIAGNOSIS — E7800 Pure hypercholesterolemia, unspecified: Secondary | ICD-10-CM | POA: Diagnosis not present

## 2021-11-27 DIAGNOSIS — M25551 Pain in right hip: Secondary | ICD-10-CM | POA: Diagnosis not present

## 2021-12-02 DIAGNOSIS — M545 Low back pain, unspecified: Secondary | ICD-10-CM | POA: Diagnosis not present

## 2021-12-02 DIAGNOSIS — M25551 Pain in right hip: Secondary | ICD-10-CM | POA: Diagnosis not present

## 2021-12-12 DIAGNOSIS — S39012D Strain of muscle, fascia and tendon of lower back, subsequent encounter: Secondary | ICD-10-CM | POA: Diagnosis not present

## 2021-12-23 DIAGNOSIS — S39012D Strain of muscle, fascia and tendon of lower back, subsequent encounter: Secondary | ICD-10-CM | POA: Diagnosis not present

## 2021-12-24 ENCOUNTER — Other Ambulatory Visit (HOSPITAL_COMMUNITY): Payer: Self-pay

## 2022-01-07 ENCOUNTER — Ambulatory Visit: Payer: Medicare Other | Admitting: Podiatry

## 2022-02-02 DIAGNOSIS — E1142 Type 2 diabetes mellitus with diabetic polyneuropathy: Secondary | ICD-10-CM | POA: Diagnosis not present

## 2022-02-02 DIAGNOSIS — E7800 Pure hypercholesterolemia, unspecified: Secondary | ICD-10-CM | POA: Diagnosis not present

## 2022-02-02 DIAGNOSIS — J4520 Mild intermittent asthma, uncomplicated: Secondary | ICD-10-CM | POA: Diagnosis not present

## 2022-02-02 DIAGNOSIS — E785 Hyperlipidemia, unspecified: Secondary | ICD-10-CM | POA: Diagnosis not present

## 2022-02-02 DIAGNOSIS — K219 Gastro-esophageal reflux disease without esophagitis: Secondary | ICD-10-CM | POA: Diagnosis not present

## 2022-02-02 DIAGNOSIS — J449 Chronic obstructive pulmonary disease, unspecified: Secondary | ICD-10-CM | POA: Diagnosis not present

## 2022-02-05 DIAGNOSIS — E119 Type 2 diabetes mellitus without complications: Secondary | ICD-10-CM | POA: Diagnosis not present

## 2022-02-05 DIAGNOSIS — H35033 Hypertensive retinopathy, bilateral: Secondary | ICD-10-CM | POA: Diagnosis not present

## 2022-02-05 DIAGNOSIS — H35373 Puckering of macula, bilateral: Secondary | ICD-10-CM | POA: Diagnosis not present

## 2022-02-05 DIAGNOSIS — H43813 Vitreous degeneration, bilateral: Secondary | ICD-10-CM | POA: Diagnosis not present

## 2022-02-09 DIAGNOSIS — N390 Urinary tract infection, site not specified: Secondary | ICD-10-CM | POA: Diagnosis not present

## 2022-02-10 ENCOUNTER — Ambulatory Visit (INDEPENDENT_AMBULATORY_CARE_PROVIDER_SITE_OTHER): Payer: Medicare Other | Admitting: Podiatry

## 2022-02-10 ENCOUNTER — Encounter: Payer: Self-pay | Admitting: Podiatry

## 2022-02-10 DIAGNOSIS — M79674 Pain in right toe(s): Secondary | ICD-10-CM | POA: Diagnosis not present

## 2022-02-10 DIAGNOSIS — E119 Type 2 diabetes mellitus without complications: Secondary | ICD-10-CM | POA: Diagnosis not present

## 2022-02-10 DIAGNOSIS — B351 Tinea unguium: Secondary | ICD-10-CM | POA: Diagnosis not present

## 2022-02-10 DIAGNOSIS — M79675 Pain in left toe(s): Secondary | ICD-10-CM

## 2022-02-10 DIAGNOSIS — M129 Arthropathy, unspecified: Secondary | ICD-10-CM | POA: Diagnosis not present

## 2022-02-10 NOTE — Progress Notes (Signed)
This patient returns to my office for at risk foot care.  This patient requires this care by a professional since this patient will be at risk due to having  Diabetes mellitus.  This patient is unable to cut nails herself since the patient cannot reach her nails.These nails are painful walking and wearing shoes.  This patient presents for at risk foot care today.  General Appearance  Alert, conversant and in no acute stress.  Vascular  Dorsalis pedis and posterior tibial  pulses are palpable  bilaterally.  Capillary return is within normal limits  bilaterally. Temperature is within normal limits  bilaterally.  Neurologic  Senn-Weinstein monofilament wire test within normal limits  bilaterally. Muscle power within normal limits bilaterally.  Nails Thick disfigured discolored nails with subungual debris  from hallux to fifth toes bilaterally. No evidence of bacterial infection or drainage bilaterally.  Orthopedic  No limitations of motion  feet .  No crepitus or effusions noted.  No bony pathology or digital deformities noted.  Midfoot arthritis  B/L  Skin  normotropic skin with no porokeratosis noted bilaterally.  No signs of infections or ulcers noted.     Onychomycosis  Pain in right toes  Pain in left toes  Chronic arthritis  B/L.  Consent was obtained for treatment procedures.   Mechanical debridement of nails 1-5  bilaterally performed with a nail nipper.  Filed with dremel without incident.    Return office visit     3 months                 Told patient to return for periodic foot care and evaluation due to potential at risk complications.   Jessamine Barcia DPM  

## 2022-02-12 DIAGNOSIS — J449 Chronic obstructive pulmonary disease, unspecified: Secondary | ICD-10-CM | POA: Diagnosis not present

## 2022-02-12 DIAGNOSIS — E1142 Type 2 diabetes mellitus with diabetic polyneuropathy: Secondary | ICD-10-CM | POA: Diagnosis not present

## 2022-02-12 DIAGNOSIS — E669 Obesity, unspecified: Secondary | ICD-10-CM | POA: Diagnosis not present

## 2022-02-12 DIAGNOSIS — G4733 Obstructive sleep apnea (adult) (pediatric): Secondary | ICD-10-CM | POA: Diagnosis not present

## 2022-02-24 DIAGNOSIS — E1142 Type 2 diabetes mellitus with diabetic polyneuropathy: Secondary | ICD-10-CM | POA: Diagnosis not present

## 2022-02-24 DIAGNOSIS — E7800 Pure hypercholesterolemia, unspecified: Secondary | ICD-10-CM | POA: Diagnosis not present

## 2022-02-24 DIAGNOSIS — J4520 Mild intermittent asthma, uncomplicated: Secondary | ICD-10-CM | POA: Diagnosis not present

## 2022-02-24 DIAGNOSIS — J449 Chronic obstructive pulmonary disease, unspecified: Secondary | ICD-10-CM | POA: Diagnosis not present

## 2022-03-10 IMAGING — US US ABDOMEN LIMITED
1 series · 14 of 25 positions shown · non-contrast
Comparison: None.

CLINICAL DATA: Right upper quadrant pain

EXAM:
ULTRASOUND ABDOMEN LIMITED RIGHT UPPER QUADRANT

[Series 1: us abdomen limited ruq (liver/gb) · 14 of 56 slices shown]
[im 1/56]
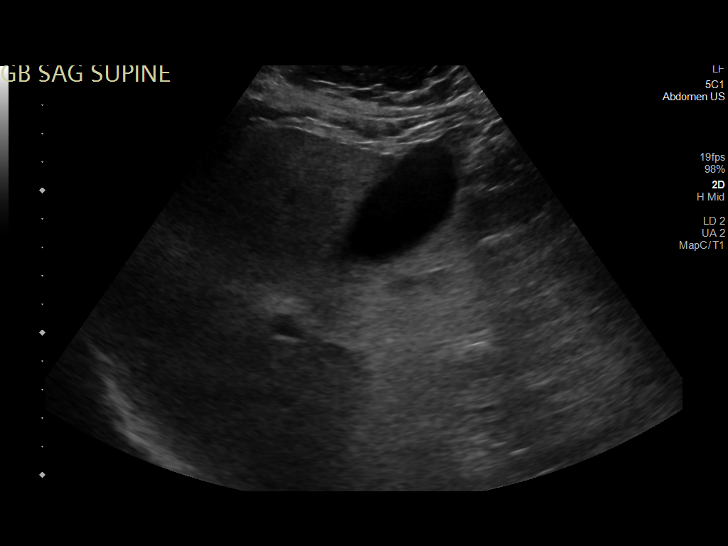
[im 5/56]
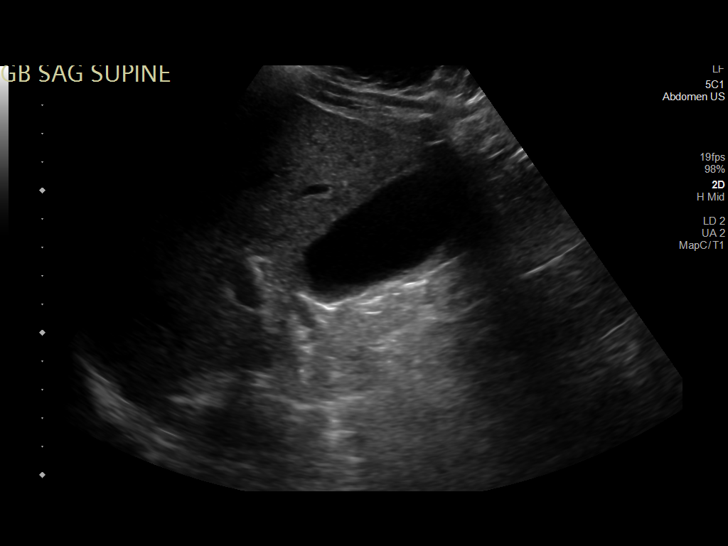
[im 10/56]
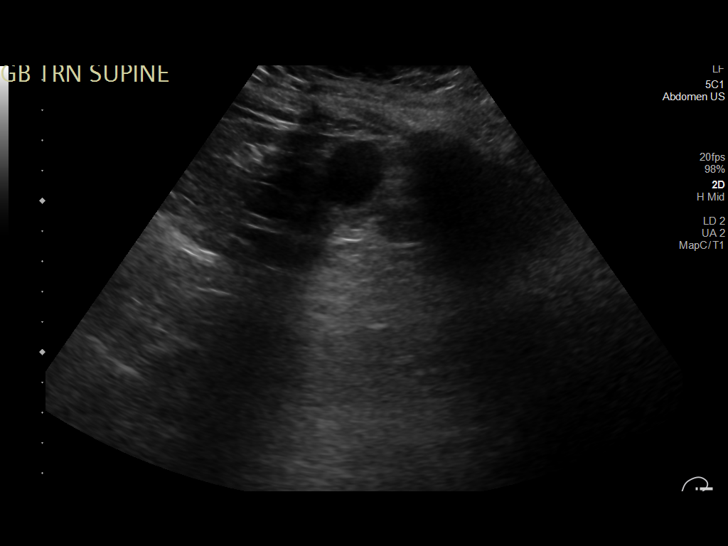
[im 14/56]
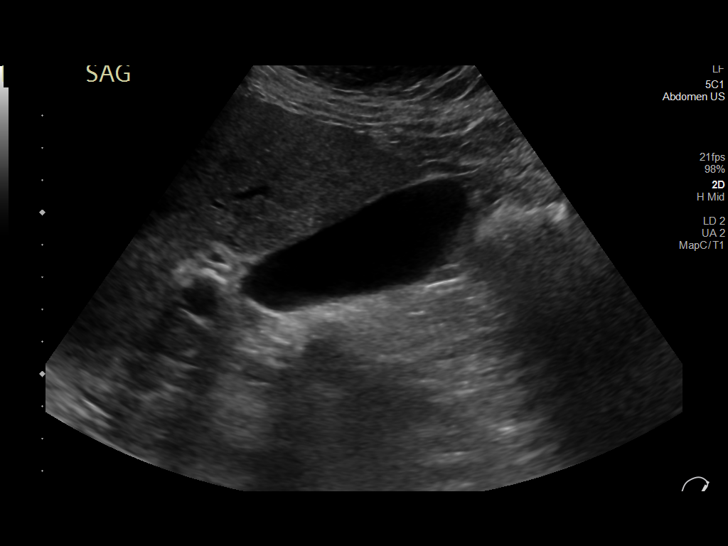
[im 19/56]
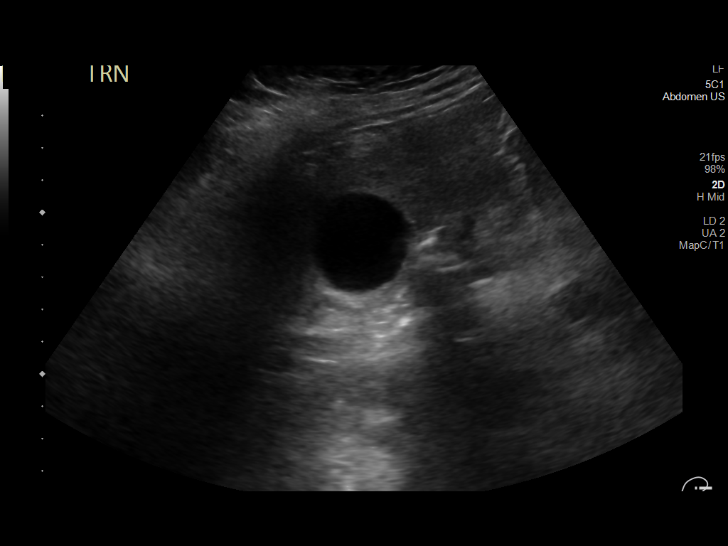
[im 21/56]
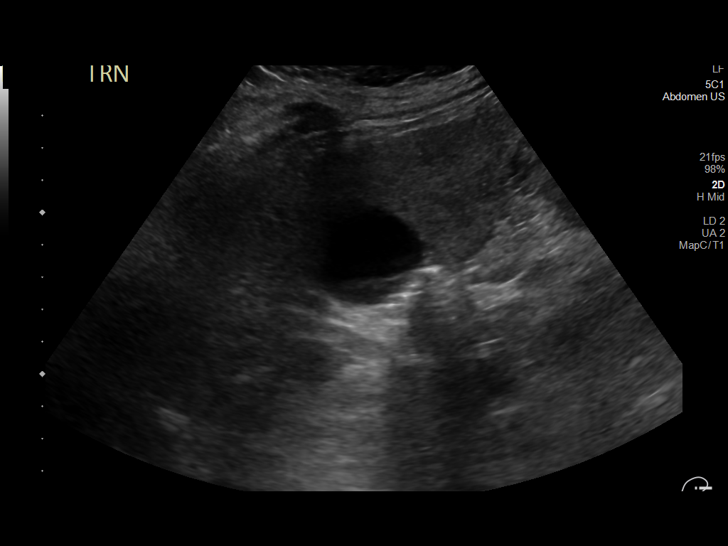
[im 26/56]
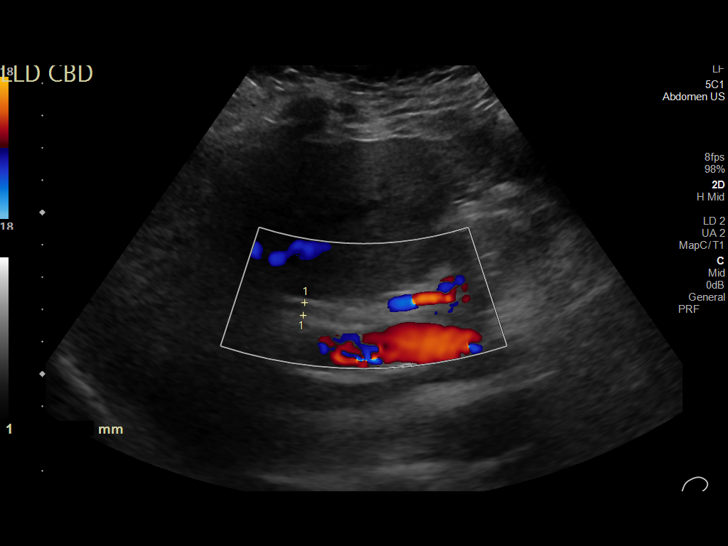
[im 30/56]
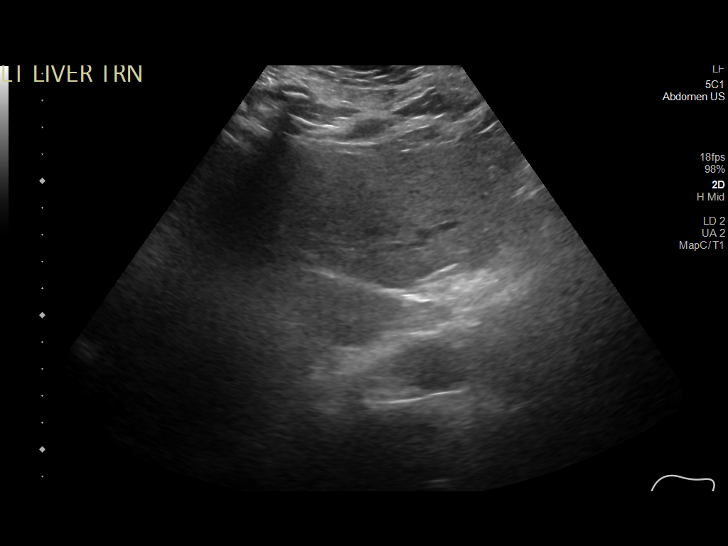
[im 35/56]
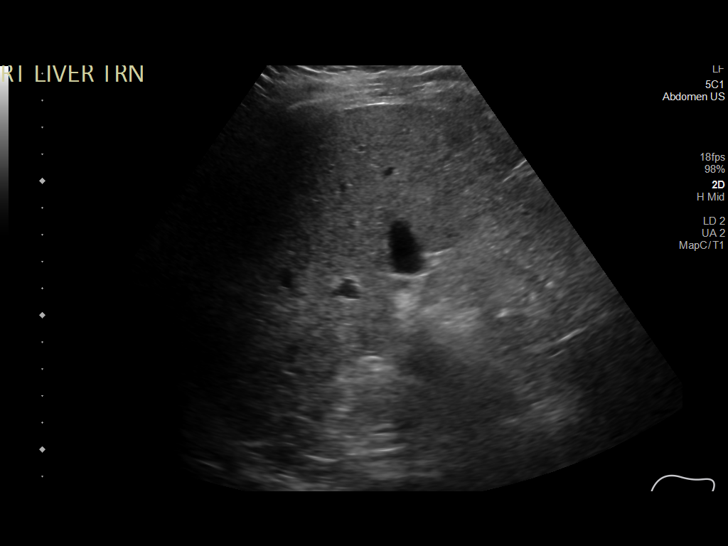
[im 37/56]
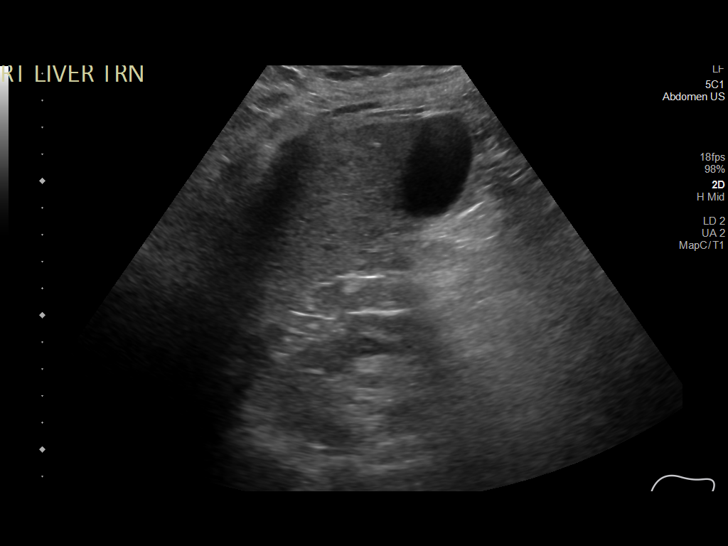
[im 42/56]
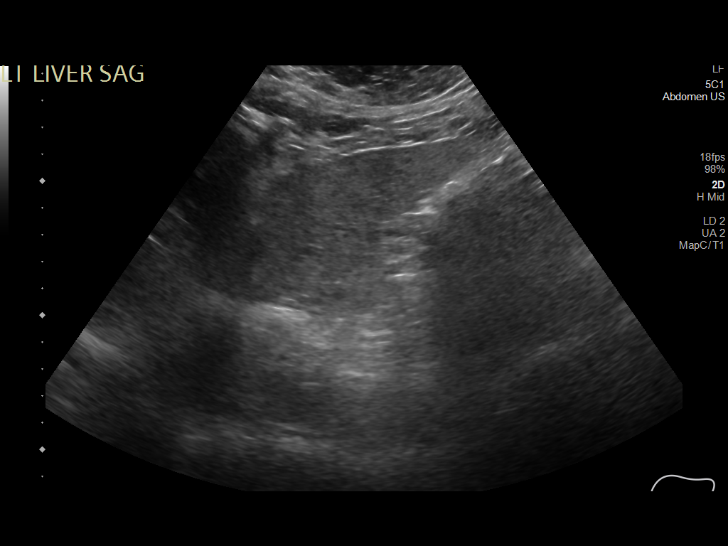
[im 46/56]
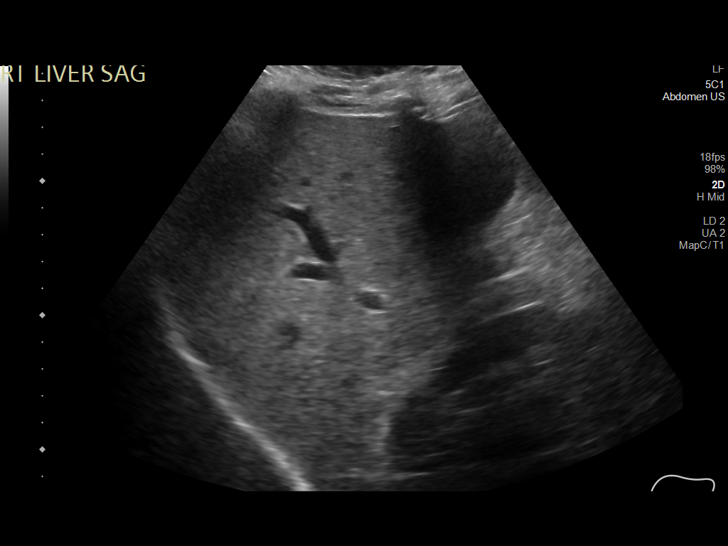
[im 51/56]
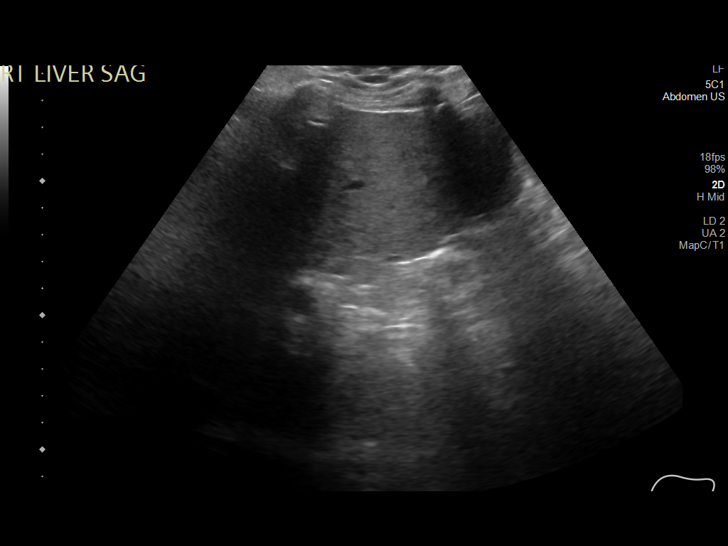
[im 56/56]
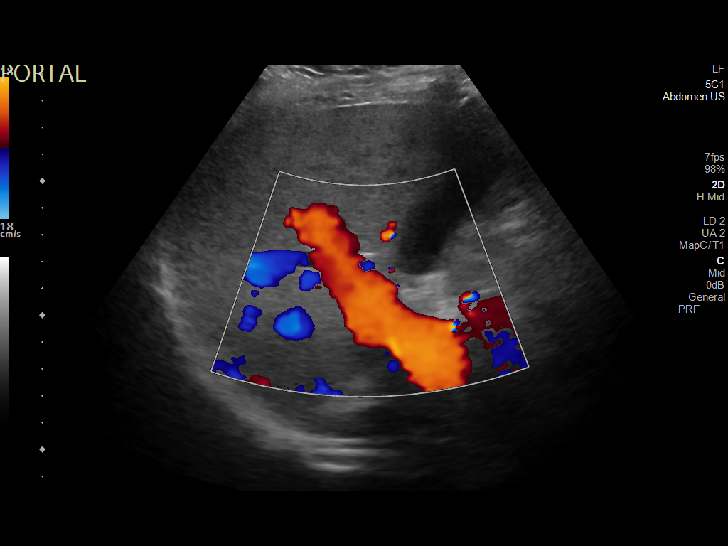

[14 of 25 positions shown; findings below may reference images not displayed]

FINDINGS: Gallbladder:

No gallstones or wall thickening visualized. No sonographic Murphy
sign noted by sonographer.

Common bile duct:

Diameter: 4

Liver:

Increased echogenicity of the parenchyma with no focal mass
identified. Portal vein is patent on color Doppler imaging with
normal direction of blood flow towards the liver.

Other: None.
IMPRESSION: Increased echogenicity liver parenchyma which may represent hepatic
steatosis and/or other hepatocellular disease.

## 2022-03-13 DIAGNOSIS — H35033 Hypertensive retinopathy, bilateral: Secondary | ICD-10-CM | POA: Diagnosis not present

## 2022-03-13 DIAGNOSIS — E119 Type 2 diabetes mellitus without complications: Secondary | ICD-10-CM | POA: Diagnosis not present

## 2022-03-13 DIAGNOSIS — H53413 Scotoma involving central area, bilateral: Secondary | ICD-10-CM | POA: Diagnosis not present

## 2022-03-13 DIAGNOSIS — H35373 Puckering of macula, bilateral: Secondary | ICD-10-CM | POA: Diagnosis not present

## 2022-05-20 ENCOUNTER — Encounter: Payer: Self-pay | Admitting: Podiatry

## 2022-05-20 ENCOUNTER — Ambulatory Visit (INDEPENDENT_AMBULATORY_CARE_PROVIDER_SITE_OTHER): Payer: Medicare Other | Admitting: Podiatry

## 2022-05-20 VITALS — BP 159/57 | HR 85

## 2022-05-20 DIAGNOSIS — B351 Tinea unguium: Secondary | ICD-10-CM

## 2022-05-20 DIAGNOSIS — M79674 Pain in right toe(s): Secondary | ICD-10-CM | POA: Diagnosis not present

## 2022-05-20 DIAGNOSIS — E119 Type 2 diabetes mellitus without complications: Secondary | ICD-10-CM | POA: Diagnosis not present

## 2022-05-20 DIAGNOSIS — M79675 Pain in left toe(s): Secondary | ICD-10-CM | POA: Diagnosis not present

## 2022-05-20 NOTE — Progress Notes (Signed)
This patient returns to my office for at risk foot care.  This patient requires this care by a professional since this patient will be at risk due to having  Diabetes mellitus.  This patient is unable to cut nails herself since the patient cannot reach her nails.These nails are painful walking and wearing shoes.  This patient presents for at risk foot care today.  General Appearance  Alert, conversant and in no acute stress.  Vascular  Dorsalis pedis and posterior tibial  pulses are palpable  bilaterally.  Capillary return is within normal limits  bilaterally. Temperature is within normal limits  bilaterally.  Neurologic  Senn-Weinstein monofilament wire test within normal limits  bilaterally. Muscle power within normal limits bilaterally.  Nails Thick disfigured discolored nails with subungual debris  from hallux to fifth toes bilaterally. No evidence of bacterial infection or drainage bilaterally.  Orthopedic  No limitations of motion  feet .  No crepitus or effusions noted.  No bony pathology or digital deformities noted.  Midfoot arthritis  B/L  Skin  normotropic skin with no porokeratosis noted bilaterally.  No signs of infections or ulcers noted.     Onychomycosis  Pain in right toes  Pain in left toes  Chronic arthritis  B/L.  Consent was obtained for treatment procedures.   Mechanical debridement of nails 1-5  bilaterally performed with a nail nipper.  Filed with dremel without incident.    Return office visit     3 months                 Told patient to return for periodic foot care and evaluation due to potential at risk complications.   Gardiner Barefoot DPM

## 2022-05-21 DIAGNOSIS — H9311 Tinnitus, right ear: Secondary | ICD-10-CM | POA: Diagnosis not present

## 2022-05-21 DIAGNOSIS — H90A32 Mixed conductive and sensorineural hearing loss, unilateral, left ear with restricted hearing on the contralateral side: Secondary | ICD-10-CM | POA: Diagnosis not present

## 2022-05-21 DIAGNOSIS — H90A21 Sensorineural hearing loss, unilateral, right ear, with restricted hearing on the contralateral side: Secondary | ICD-10-CM | POA: Diagnosis not present

## 2022-05-21 DIAGNOSIS — H8092 Unspecified otosclerosis, left ear: Secondary | ICD-10-CM | POA: Diagnosis not present

## 2022-05-21 DIAGNOSIS — E119 Type 2 diabetes mellitus without complications: Secondary | ICD-10-CM | POA: Diagnosis not present

## 2022-05-28 DIAGNOSIS — E7800 Pure hypercholesterolemia, unspecified: Secondary | ICD-10-CM | POA: Diagnosis not present

## 2022-05-28 DIAGNOSIS — N39498 Other specified urinary incontinence: Secondary | ICD-10-CM | POA: Diagnosis not present

## 2022-05-28 DIAGNOSIS — G4730 Sleep apnea, unspecified: Secondary | ICD-10-CM | POA: Diagnosis not present

## 2022-05-28 DIAGNOSIS — M85852 Other specified disorders of bone density and structure, left thigh: Secondary | ICD-10-CM | POA: Diagnosis not present

## 2022-05-28 DIAGNOSIS — J449 Chronic obstructive pulmonary disease, unspecified: Secondary | ICD-10-CM | POA: Diagnosis not present

## 2022-05-28 DIAGNOSIS — K219 Gastro-esophageal reflux disease without esophagitis: Secondary | ICD-10-CM | POA: Diagnosis not present

## 2022-05-28 DIAGNOSIS — Z0000 Encounter for general adult medical examination without abnormal findings: Secondary | ICD-10-CM | POA: Diagnosis not present

## 2022-05-28 DIAGNOSIS — E1142 Type 2 diabetes mellitus with diabetic polyneuropathy: Secondary | ICD-10-CM | POA: Diagnosis not present

## 2022-07-09 ENCOUNTER — Other Ambulatory Visit (HOSPITAL_COMMUNITY): Payer: Self-pay

## 2022-08-26 ENCOUNTER — Ambulatory Visit: Payer: Medicare Other | Admitting: Podiatry

## 2022-08-27 DIAGNOSIS — G4733 Obstructive sleep apnea (adult) (pediatric): Secondary | ICD-10-CM | POA: Diagnosis not present

## 2022-09-15 DIAGNOSIS — J189 Pneumonia, unspecified organism: Secondary | ICD-10-CM | POA: Diagnosis not present

## 2022-09-15 DIAGNOSIS — R059 Cough, unspecified: Secondary | ICD-10-CM | POA: Diagnosis not present

## 2022-09-24 ENCOUNTER — Ambulatory Visit: Payer: Medicare Other | Admitting: Podiatry

## 2022-09-29 ENCOUNTER — Ambulatory Visit: Payer: Medicare Other | Admitting: Podiatry

## 2022-10-12 DIAGNOSIS — R350 Frequency of micturition: Secondary | ICD-10-CM | POA: Diagnosis not present

## 2022-10-12 DIAGNOSIS — N390 Urinary tract infection, site not specified: Secondary | ICD-10-CM | POA: Diagnosis not present

## 2022-11-17 ENCOUNTER — Ambulatory Visit: Payer: Medicare Other | Admitting: Podiatry

## 2022-12-02 DIAGNOSIS — K5909 Other constipation: Secondary | ICD-10-CM | POA: Diagnosis not present

## 2022-12-02 DIAGNOSIS — E1142 Type 2 diabetes mellitus with diabetic polyneuropathy: Secondary | ICD-10-CM | POA: Diagnosis not present

## 2022-12-02 DIAGNOSIS — N3281 Overactive bladder: Secondary | ICD-10-CM | POA: Diagnosis not present

## 2022-12-02 DIAGNOSIS — E559 Vitamin D deficiency, unspecified: Secondary | ICD-10-CM | POA: Diagnosis not present

## 2022-12-02 DIAGNOSIS — K219 Gastro-esophageal reflux disease without esophagitis: Secondary | ICD-10-CM | POA: Diagnosis not present

## 2022-12-02 DIAGNOSIS — G4730 Sleep apnea, unspecified: Secondary | ICD-10-CM | POA: Diagnosis not present

## 2022-12-02 DIAGNOSIS — N39498 Other specified urinary incontinence: Secondary | ICD-10-CM | POA: Diagnosis not present

## 2022-12-02 DIAGNOSIS — J449 Chronic obstructive pulmonary disease, unspecified: Secondary | ICD-10-CM | POA: Diagnosis not present

## 2022-12-02 DIAGNOSIS — E7800 Pure hypercholesterolemia, unspecified: Secondary | ICD-10-CM | POA: Diagnosis not present

## 2023-01-18 DIAGNOSIS — H524 Presbyopia: Secondary | ICD-10-CM | POA: Diagnosis not present

## 2023-01-18 DIAGNOSIS — H5213 Myopia, bilateral: Secondary | ICD-10-CM | POA: Diagnosis not present

## 2023-01-18 DIAGNOSIS — H35033 Hypertensive retinopathy, bilateral: Secondary | ICD-10-CM | POA: Diagnosis not present

## 2023-01-18 DIAGNOSIS — H35373 Puckering of macula, bilateral: Secondary | ICD-10-CM | POA: Diagnosis not present

## 2023-01-18 DIAGNOSIS — H43813 Vitreous degeneration, bilateral: Secondary | ICD-10-CM | POA: Diagnosis not present

## 2023-01-18 DIAGNOSIS — I10 Essential (primary) hypertension: Secondary | ICD-10-CM | POA: Diagnosis not present

## 2023-02-12 ENCOUNTER — Ambulatory Visit: Payer: Medicare Other | Admitting: Podiatry

## 2023-02-18 DIAGNOSIS — G4733 Obstructive sleep apnea (adult) (pediatric): Secondary | ICD-10-CM | POA: Diagnosis not present

## 2023-02-18 DIAGNOSIS — I10 Essential (primary) hypertension: Secondary | ICD-10-CM | POA: Diagnosis not present

## 2023-02-26 DIAGNOSIS — N644 Mastodynia: Secondary | ICD-10-CM | POA: Diagnosis not present

## 2023-02-26 DIAGNOSIS — N6315 Unspecified lump in the right breast, overlapping quadrants: Secondary | ICD-10-CM | POA: Diagnosis not present

## 2023-03-22 ENCOUNTER — Ambulatory Visit (INDEPENDENT_AMBULATORY_CARE_PROVIDER_SITE_OTHER): Payer: Medicare Other | Admitting: Podiatry

## 2023-03-22 ENCOUNTER — Encounter: Payer: Self-pay | Admitting: Podiatry

## 2023-03-22 DIAGNOSIS — M79675 Pain in left toe(s): Secondary | ICD-10-CM

## 2023-03-22 DIAGNOSIS — B351 Tinea unguium: Secondary | ICD-10-CM | POA: Diagnosis not present

## 2023-03-22 DIAGNOSIS — E119 Type 2 diabetes mellitus without complications: Secondary | ICD-10-CM | POA: Diagnosis not present

## 2023-03-22 DIAGNOSIS — M79674 Pain in right toe(s): Secondary | ICD-10-CM

## 2023-03-22 NOTE — Progress Notes (Signed)
This patient returns to my office for at risk foot care.  This patient requires this care by a professional since this patient will be at risk due to having  Diabetes mellitus.  This patient is unable to cut nails herself since the patient cannot reach her nails.These nails are painful walking and wearing shoes.  This patient presents for at risk foot care today.  General Appearance  Alert, conversant and in no acute stress.  Vascular  Dorsalis pedis and posterior tibial  pulses are palpable  bilaterally.  Capillary return is within normal limits  bilaterally. Temperature is within normal limits  bilaterally.  Neurologic  Senn-Weinstein monofilament wire test within normal limits  bilaterally. Muscle power within normal limits bilaterally.  Nails Thick disfigured discolored nails with subungual debris  from hallux to fifth toes bilaterally. No evidence of bacterial infection or drainage bilaterally.  Orthopedic  No limitations of motion  feet .  No crepitus or effusions noted.  No bony pathology or digital deformities noted.  Midfoot arthritis  B/L  Skin  normotropic skin with no porokeratosis noted bilaterally.  No signs of infections or ulcers noted.     Onychomycosis  Pain in right toes  Pain in left toes  Chronic arthritis  B/L.  Consent was obtained for treatment procedures.   Mechanical debridement of nails 1-5  bilaterally performed with a nail nipper.  Filed with dremel without incident.    Return office visit     4 months                 Told patient to return for periodic foot care and evaluation due to potential at risk complications.   Helane Gunther DPM

## 2023-03-29 DIAGNOSIS — S00411A Abrasion of right ear, initial encounter: Secondary | ICD-10-CM | POA: Diagnosis not present

## 2023-03-29 DIAGNOSIS — Z6833 Body mass index (BMI) 33.0-33.9, adult: Secondary | ICD-10-CM | POA: Diagnosis not present

## 2023-06-08 DIAGNOSIS — E7800 Pure hypercholesterolemia, unspecified: Secondary | ICD-10-CM | POA: Diagnosis not present

## 2023-06-08 DIAGNOSIS — K219 Gastro-esophageal reflux disease without esophagitis: Secondary | ICD-10-CM | POA: Diagnosis not present

## 2023-06-08 DIAGNOSIS — G4730 Sleep apnea, unspecified: Secondary | ICD-10-CM | POA: Diagnosis not present

## 2023-06-08 DIAGNOSIS — Z0000 Encounter for general adult medical examination without abnormal findings: Secondary | ICD-10-CM | POA: Diagnosis not present

## 2023-06-08 DIAGNOSIS — J449 Chronic obstructive pulmonary disease, unspecified: Secondary | ICD-10-CM | POA: Diagnosis not present

## 2023-06-08 DIAGNOSIS — M85852 Other specified disorders of bone density and structure, left thigh: Secondary | ICD-10-CM | POA: Diagnosis not present

## 2023-06-08 DIAGNOSIS — R0789 Other chest pain: Secondary | ICD-10-CM | POA: Diagnosis not present

## 2023-06-08 DIAGNOSIS — R350 Frequency of micturition: Secondary | ICD-10-CM | POA: Diagnosis not present

## 2023-06-08 DIAGNOSIS — N3281 Overactive bladder: Secondary | ICD-10-CM | POA: Diagnosis not present

## 2023-06-08 DIAGNOSIS — K5909 Other constipation: Secondary | ICD-10-CM | POA: Diagnosis not present

## 2023-06-08 DIAGNOSIS — E559 Vitamin D deficiency, unspecified: Secondary | ICD-10-CM | POA: Diagnosis not present

## 2023-06-08 DIAGNOSIS — E1142 Type 2 diabetes mellitus with diabetic polyneuropathy: Secondary | ICD-10-CM | POA: Diagnosis not present

## 2023-07-05 ENCOUNTER — Other Ambulatory Visit (HOSPITAL_BASED_OUTPATIENT_CLINIC_OR_DEPARTMENT_OTHER): Payer: Self-pay

## 2023-07-15 DIAGNOSIS — M810 Age-related osteoporosis without current pathological fracture: Secondary | ICD-10-CM | POA: Diagnosis not present

## 2023-07-16 ENCOUNTER — Ambulatory Visit (INDEPENDENT_AMBULATORY_CARE_PROVIDER_SITE_OTHER): Admitting: Podiatry

## 2023-07-16 ENCOUNTER — Encounter: Payer: Self-pay | Admitting: Podiatry

## 2023-07-16 DIAGNOSIS — E119 Type 2 diabetes mellitus without complications: Secondary | ICD-10-CM | POA: Diagnosis not present

## 2023-07-16 DIAGNOSIS — B351 Tinea unguium: Secondary | ICD-10-CM | POA: Diagnosis not present

## 2023-07-16 DIAGNOSIS — M79675 Pain in left toe(s): Secondary | ICD-10-CM | POA: Diagnosis not present

## 2023-07-16 DIAGNOSIS — M79674 Pain in right toe(s): Secondary | ICD-10-CM | POA: Diagnosis not present

## 2023-07-16 NOTE — Progress Notes (Signed)
 This patient returns to my office for at risk foot care.  This patient requires this care by a professional since this patient will be at risk due to having  Diabetes mellitus.  This patient is unable to cut nails herself since the patient cannot reach her nails.These nails are painful walking and wearing shoes.  This patient presents for at risk foot care today.  General Appearance  Alert, conversant and in no acute stress.  Vascular  Dorsalis pedis and posterior tibial  pulses are palpable  bilaterally.  Capillary return is within normal limits  bilaterally. Temperature is within normal limits  bilaterally.  Neurologic  Senn-Weinstein monofilament wire test within normal limits  bilaterally. Muscle power within normal limits bilaterally.  Nails Thick disfigured discolored nails with subungual debris  from hallux to fifth toes bilaterally. No evidence of bacterial infection or drainage bilaterally.  Orthopedic  No limitations of motion  feet .  No crepitus or effusions noted.  No bony pathology or digital deformities noted.  Midfoot arthritis  B/L  Skin  normotropic skin with no porokeratosis noted bilaterally.  No signs of infections or ulcers noted.     Onychomycosis  Pain in right toes  Pain in left toes  Chronic arthritis  B/L.  Consent was obtained for treatment procedures.   Mechanical debridement of nails 1-5  bilaterally performed with a nail nipper.  Filed with dremel without incident.    Return office visit     4 months                 Told patient to return for periodic foot care and evaluation due to potential at risk complications.   Helane Gunther DPM

## 2023-07-22 ENCOUNTER — Ambulatory Visit: Payer: Medicare Other | Admitting: Podiatry

## 2023-09-27 ENCOUNTER — Other Ambulatory Visit (HOSPITAL_COMMUNITY): Payer: Self-pay | Admitting: Family Medicine

## 2023-09-27 DIAGNOSIS — R1030 Lower abdominal pain, unspecified: Secondary | ICD-10-CM | POA: Diagnosis not present

## 2023-09-29 ENCOUNTER — Ambulatory Visit (HOSPITAL_BASED_OUTPATIENT_CLINIC_OR_DEPARTMENT_OTHER)
Admission: RE | Admit: 2023-09-29 | Discharge: 2023-09-29 | Disposition: A | Discharge status: Home / Self Care | Source: Ambulatory Visit | Attending: Family Medicine | Admitting: Family Medicine

## 2023-09-29 ENCOUNTER — Encounter (HOSPITAL_BASED_OUTPATIENT_CLINIC_OR_DEPARTMENT_OTHER): Payer: Self-pay

## 2023-09-29 DIAGNOSIS — R1030 Lower abdominal pain, unspecified: Secondary | ICD-10-CM | POA: Insufficient documentation

## 2023-09-29 MED ORDER — IOHEXOL 300 MG/ML  SOLN
100.0000 mL | Freq: Once | INTRAMUSCULAR | Status: AC | PRN
  Administered 2023-09-29: 100 mL via INTRAVENOUS

## 2023-09-30 DIAGNOSIS — N201 Calculus of ureter: Secondary | ICD-10-CM | POA: Diagnosis not present

## 2023-09-30 DIAGNOSIS — R1084 Generalized abdominal pain: Secondary | ICD-10-CM | POA: Diagnosis not present

## 2023-10-04 ENCOUNTER — Encounter (HOSPITAL_COMMUNITY): Payer: Self-pay | Admitting: Urology

## 2023-10-04 ENCOUNTER — Ambulatory Visit (HOSPITAL_COMMUNITY)

## 2023-10-04 ENCOUNTER — Other Ambulatory Visit: Payer: Self-pay | Admitting: Urology

## 2023-10-04 NOTE — Progress Notes (Signed)
 Spoke w/ via phone for pre-op interview---[facility] needs dos----KUB              COVID test -----patient states asymptomatic no test needed Arrive at -------1030 NPO after MN NO Solid Food.  Clear liquids from MN until---0630 Med rec completed. Pt aware to hold ASA/NSAIDs and supplements per [facility] protocol.  Medications to take morning of surgery -----albuterol  nexium gabapentin  antivert reglan  Diabetic/Weight loss medication ----- No Alcohol or recreational drugs for 24 hours/Tobacco products for 6 hours ---- Patient instructed to bring blue lithotripsy folder, photo id and insurance card day of surgery. Patient aware to have Driver (ride ) / caregiver for 24 hours after surgery -----Hermeta or renee Patient Special Instructions -----bring blue folder and wear comfortable clothes. Pre-Op special Instructions ----- take laxative of choice day before procedure Patient verbalized understanding of instructions that were given at this phone interview. Patient denies shortness of breath, chest pain, fever, cough at this phone interview.

## 2023-10-11 ENCOUNTER — Ambulatory Visit (HOSPITAL_COMMUNITY)

## 2023-10-11 ENCOUNTER — Encounter (HOSPITAL_COMMUNITY)
Admission: RE | Disposition: A | Discharge status: Home / Self Care | Payer: Self-pay | Source: Home / Self Care | Attending: Urology

## 2023-10-11 ENCOUNTER — Encounter (HOSPITAL_COMMUNITY): Payer: Self-pay | Admitting: Urology

## 2023-10-11 ENCOUNTER — Other Ambulatory Visit: Payer: Self-pay

## 2023-10-11 ENCOUNTER — Ambulatory Visit (HOSPITAL_COMMUNITY)
Admission: RE | Admit: 2023-10-11 | Discharge: 2023-10-11 | Disposition: A | Discharge status: Home / Self Care | Attending: Urology | Admitting: Urology

## 2023-10-11 DIAGNOSIS — N202 Calculus of kidney with calculus of ureter: Secondary | ICD-10-CM | POA: Diagnosis not present

## 2023-10-11 DIAGNOSIS — N201 Calculus of ureter: Secondary | ICD-10-CM | POA: Diagnosis not present

## 2023-10-11 DIAGNOSIS — Z538 Procedure and treatment not carried out for other reasons: Secondary | ICD-10-CM | POA: Insufficient documentation

## 2023-10-11 HISTORY — PX: EXTRACORPOREAL SHOCK WAVE LITHOTRIPSY: SHX1557

## 2023-10-11 LAB — GLUCOSE, CAPILLARY: Glucose-Capillary: 100 mg/dL — ABNORMAL HIGH (ref 70–99)

## 2023-10-11 SURGERY — LITHOTRIPSY, ESWL
Anesthesia: LOCAL | Laterality: Right

## 2023-10-11 MED ORDER — DIAZEPAM 5 MG PO TABS
5.0000 mg | ORAL_TABLET | Freq: Once | ORAL | Status: AC
  Administered 2023-10-11: 5 mg via ORAL
  Filled 2023-10-11: qty 1

## 2023-10-11 MED ORDER — BISACODYL 5 MG PO TBEC: 5.0000 mg | DELAYED_RELEASE_TABLET | Freq: Every day | ORAL | Status: DC | PRN

## 2023-10-11 MED ORDER — DIPHENHYDRAMINE HCL 25 MG PO CAPS
25.0000 mg | ORAL_CAPSULE | ORAL | Status: AC
  Administered 2023-10-11: 25 mg via ORAL
  Filled 2023-10-11: qty 1

## 2023-10-11 MED ORDER — SODIUM CHLORIDE 0.9 % IV SOLN: INTRAVENOUS | Status: DC

## 2023-10-11 MED ORDER — CIPROFLOXACIN HCL 500 MG PO TABS
500.0000 mg | ORAL_TABLET | ORAL | Status: AC
  Administered 2023-10-11: 500 mg via ORAL
  Filled 2023-10-11: qty 1

## 2023-10-11 MED ORDER — DIAZEPAM 5 MG PO TABS: 10.0000 mg | ORAL_TABLET | ORAL | Status: DC

## 2023-10-11 NOTE — Progress Notes (Signed)
 Litho / Short Stay RN Note Per [employer], Litho cancelled, per MD client passed stone and did not require tx. Returned to [employer], CBG assessed, VSS, denies any discomfort. IV discontinued. AVS provided on Moderate Sedation and pt teaching done re: care at home post moderate sedation. Opportunity for questions provided, copy of AVS also provided.

## 2023-10-11 NOTE — Progress Notes (Signed)
 The patient's previously identified right distal ureteral stone is no longer seen on KUB or live fluoroscopy.  She denies any recent episodes of flank pain but has not seen a stone pass yet.  Will arrange OP CT to confirm.

## 2023-10-11 NOTE — H&P (Signed)
See scanned [facility] documents for H&P.   

## 2023-10-12 ENCOUNTER — Encounter (HOSPITAL_COMMUNITY): Payer: Self-pay | Admitting: Urology

## 2023-10-12 DIAGNOSIS — N179 Acute kidney failure, unspecified: Secondary | ICD-10-CM | POA: Diagnosis not present

## 2023-10-20 ENCOUNTER — Ambulatory Visit (INDEPENDENT_AMBULATORY_CARE_PROVIDER_SITE_OTHER): Admitting: Podiatry

## 2023-10-20 ENCOUNTER — Encounter: Payer: Self-pay | Admitting: Podiatry

## 2023-10-20 DIAGNOSIS — B351 Tinea unguium: Secondary | ICD-10-CM | POA: Diagnosis not present

## 2023-10-20 DIAGNOSIS — E119 Type 2 diabetes mellitus without complications: Secondary | ICD-10-CM | POA: Diagnosis not present

## 2023-10-20 DIAGNOSIS — M79675 Pain in left toe(s): Secondary | ICD-10-CM

## 2023-10-20 DIAGNOSIS — M79674 Pain in right toe(s): Secondary | ICD-10-CM | POA: Diagnosis not present

## 2023-10-20 NOTE — Progress Notes (Signed)
 This patient returns to my office for at risk foot care.  This patient requires this care by a professional since this patient will be at risk due to having  Diabetes mellitus.  This patient is unable to cut nails herself since the patient cannot reach her nails.These nails are painful walking and wearing shoes.  This patient presents for at risk foot care today.  General Appearance  Alert, conversant and in no acute stress.  Vascular  Dorsalis pedis and posterior tibial  pulses are palpable  bilaterally.  Capillary return is within normal limits  bilaterally. Temperature is within normal limits  bilaterally.  Neurologic  Senn-Weinstein monofilament wire test within normal limits  bilaterally. Muscle power within normal limits bilaterally.  Nails Thick disfigured discolored nails with subungual debris  from hallux to fifth toes bilaterally. No evidence of bacterial infection or drainage bilaterally.  Orthopedic  No limitations of motion  feet .  No crepitus or effusions noted.  No bony pathology or digital deformities noted.  Midfoot arthritis  B/L  Skin  normotropic skin with no porokeratosis noted bilaterally.  No signs of infections or ulcers noted.     Onychomycosis  Pain in right toes  Pain in left toes  Chronic arthritis  B/L.  Consent was obtained for treatment procedures.   Mechanical debridement of nails 1-5  bilaterally performed with a nail nipper.  Filed with dremel without incident.    Return office visit     4 months                 Told patient to return for periodic foot care and evaluation due to potential at risk complications.   Helane Gunther DPM

## 2023-10-22 DIAGNOSIS — N1330 Unspecified hydronephrosis: Secondary | ICD-10-CM | POA: Diagnosis not present

## 2023-10-22 DIAGNOSIS — N200 Calculus of kidney: Secondary | ICD-10-CM | POA: Diagnosis not present

## 2023-10-22 DIAGNOSIS — N201 Calculus of ureter: Secondary | ICD-10-CM | POA: Diagnosis not present

## 2023-10-22 DIAGNOSIS — K760 Fatty (change of) liver, not elsewhere classified: Secondary | ICD-10-CM | POA: Diagnosis not present

## 2023-10-25 ENCOUNTER — Other Ambulatory Visit: Payer: Self-pay

## 2023-10-25 DIAGNOSIS — K760 Fatty (change of) liver, not elsewhere classified: Secondary | ICD-10-CM

## 2023-10-28 DIAGNOSIS — K760 Fatty (change of) liver, not elsewhere classified: Secondary | ICD-10-CM | POA: Diagnosis not present

## 2023-10-29 ENCOUNTER — Ambulatory Visit
Admission: RE | Admit: 2023-10-29 | Discharge: 2023-10-29 | Disposition: A | Discharge status: Home / Self Care | Source: Ambulatory Visit

## 2023-10-29 DIAGNOSIS — K760 Fatty (change of) liver, not elsewhere classified: Secondary | ICD-10-CM

## 2023-12-22 DIAGNOSIS — K219 Gastro-esophageal reflux disease without esophagitis: Secondary | ICD-10-CM | POA: Diagnosis not present

## 2023-12-22 DIAGNOSIS — J449 Chronic obstructive pulmonary disease, unspecified: Secondary | ICD-10-CM | POA: Diagnosis not present

## 2023-12-22 DIAGNOSIS — E559 Vitamin D deficiency, unspecified: Secondary | ICD-10-CM | POA: Diagnosis not present

## 2023-12-22 DIAGNOSIS — M545 Low back pain, unspecified: Secondary | ICD-10-CM | POA: Diagnosis not present

## 2023-12-22 DIAGNOSIS — E7800 Pure hypercholesterolemia, unspecified: Secondary | ICD-10-CM | POA: Diagnosis not present

## 2023-12-22 DIAGNOSIS — K760 Fatty (change of) liver, not elsewhere classified: Secondary | ICD-10-CM | POA: Diagnosis not present

## 2023-12-22 DIAGNOSIS — N1831 Chronic kidney disease, stage 3a: Secondary | ICD-10-CM | POA: Diagnosis not present

## 2023-12-22 DIAGNOSIS — Z23 Encounter for immunization: Secondary | ICD-10-CM | POA: Diagnosis not present

## 2023-12-22 DIAGNOSIS — N3281 Overactive bladder: Secondary | ICD-10-CM | POA: Diagnosis not present

## 2023-12-22 DIAGNOSIS — G4730 Sleep apnea, unspecified: Secondary | ICD-10-CM | POA: Diagnosis not present

## 2023-12-22 DIAGNOSIS — M85851 Other specified disorders of bone density and structure, right thigh: Secondary | ICD-10-CM | POA: Diagnosis not present

## 2023-12-22 DIAGNOSIS — E1142 Type 2 diabetes mellitus with diabetic polyneuropathy: Secondary | ICD-10-CM | POA: Diagnosis not present

## 2024-01-26 ENCOUNTER — Ambulatory Visit: Admitting: Podiatry

## 2024-01-26 ENCOUNTER — Encounter: Payer: Self-pay | Admitting: Podiatry

## 2024-01-26 DIAGNOSIS — B351 Tinea unguium: Secondary | ICD-10-CM

## 2024-01-26 DIAGNOSIS — M79674 Pain in right toe(s): Secondary | ICD-10-CM

## 2024-01-26 DIAGNOSIS — M79675 Pain in left toe(s): Secondary | ICD-10-CM

## 2024-01-26 DIAGNOSIS — E119 Type 2 diabetes mellitus without complications: Secondary | ICD-10-CM

## 2024-01-26 NOTE — Progress Notes (Signed)
 This patient returns to my office for at risk foot care.  This patient requires this care by a professional since this patient will be at risk due to having  Diabetes mellitus.  This patient is unable to cut nails herself since the patient cannot reach her nails.These nails are painful walking and wearing shoes.  This patient presents for at risk foot care today.  General Appearance  Alert, conversant and in no acute stress.  Vascular  Dorsalis pedis and posterior tibial  pulses are palpable  bilaterally.  Capillary return is within normal limits  bilaterally. Temperature is within normal limits  bilaterally.  Neurologic  Senn-Weinstein monofilament wire test within normal limits  bilaterally. Muscle power within normal limits bilaterally.  Nails Thick disfigured discolored nails with subungual debris  from hallux to fifth toes bilaterally. No evidence of bacterial infection or drainage bilaterally.  Orthopedic  No limitations of motion  feet .  No crepitus or effusions noted.  No bony pathology or digital deformities noted.  Midfoot arthritis  B/L  Skin  normotropic skin with no porokeratosis noted bilaterally.  No signs of infections or ulcers noted.     Onychomycosis  Pain in right toes  Pain in left toes  Chronic arthritis  B/L.  Consent was obtained for treatment procedures.   Mechanical debridement of nails 1-5  bilaterally performed with a nail nipper.  Filed with dremel without incident.    Return office visit     4 months                 Told patient to return for periodic foot care and evaluation due to potential at risk complications.   Helane Gunther DPM

## 2024-02-24 DIAGNOSIS — G4733 Obstructive sleep apnea (adult) (pediatric): Secondary | ICD-10-CM | POA: Diagnosis not present

## 2024-02-24 DIAGNOSIS — J449 Chronic obstructive pulmonary disease, unspecified: Secondary | ICD-10-CM | POA: Diagnosis not present

## 2024-04-27 ENCOUNTER — Ambulatory Visit: Admitting: Podiatry

## 2024-05-24 ENCOUNTER — Ambulatory Visit: Admitting: Podiatry
